# Patient Record
Sex: Female | Born: 1970
Health system: Southern US, Community
[De-identification: ages and names within clinical notes are randomized; demographics above are authoritative.]

## PROBLEM LIST (undated history)

## (undated) DIAGNOSIS — G43909 Migraine, unspecified, not intractable, without status migrainosus: Secondary | ICD-10-CM

## (undated) DIAGNOSIS — G479 Sleep disorder, unspecified: Secondary | ICD-10-CM

## (undated) DIAGNOSIS — F32A Depression, unspecified: Secondary | ICD-10-CM

## (undated) DIAGNOSIS — M542 Cervicalgia: Secondary | ICD-10-CM

## (undated) DIAGNOSIS — F329 Major depressive disorder, single episode, unspecified: Secondary | ICD-10-CM

## (undated) DIAGNOSIS — G8929 Other chronic pain: Secondary | ICD-10-CM

## (undated) DIAGNOSIS — E039 Hypothyroidism, unspecified: Secondary | ICD-10-CM

## (undated) DIAGNOSIS — R87619 Unspecified abnormal cytological findings in specimens from cervix uteri: Secondary | ICD-10-CM

## (undated) DIAGNOSIS — E079 Disorder of thyroid, unspecified: Secondary | ICD-10-CM

## (undated) DIAGNOSIS — M797 Fibromyalgia: Secondary | ICD-10-CM

## (undated) DIAGNOSIS — K219 Gastro-esophageal reflux disease without esophagitis: Secondary | ICD-10-CM

## (undated) DIAGNOSIS — F419 Anxiety disorder, unspecified: Secondary | ICD-10-CM

## (undated) HISTORY — DX: Hypothyroidism, unspecified: E03.9

## (undated) HISTORY — DX: Anxiety disorder, unspecified: F41.9

## (undated) HISTORY — PX: TUBAL LIGATION: SHX77

## (undated) HISTORY — DX: Major depressive disorder, single episode, unspecified: F32.9

## (undated) HISTORY — DX: Sleep disorder, unspecified: G47.9

## (undated) HISTORY — DX: Unspecified abnormal cytological findings in specimens from cervix uteri: R87.619

## (undated) HISTORY — DX: Depression, unspecified: F32.A

## (undated) HISTORY — DX: Gastro-esophageal reflux disease without esophagitis: K21.9

---

## 2005-03-01 DIAGNOSIS — J4520 Mild intermittent asthma, uncomplicated: Secondary | ICD-10-CM | POA: Insufficient documentation

## 2007-10-04 DIAGNOSIS — M797 Fibromyalgia: Secondary | ICD-10-CM | POA: Insufficient documentation

## 2010-11-21 DIAGNOSIS — K219 Gastro-esophageal reflux disease without esophagitis: Secondary | ICD-10-CM | POA: Insufficient documentation

## 2010-11-21 DIAGNOSIS — G43719 Chronic migraine without aura, intractable, without status migrainosus: Secondary | ICD-10-CM | POA: Insufficient documentation

## 2011-08-24 DIAGNOSIS — G479 Sleep disorder, unspecified: Secondary | ICD-10-CM | POA: Insufficient documentation

## 2011-10-02 DIAGNOSIS — M47816 Spondylosis without myelopathy or radiculopathy, lumbar region: Secondary | ICD-10-CM | POA: Insufficient documentation

## 2013-07-04 DIAGNOSIS — F338 Other recurrent depressive disorders: Secondary | ICD-10-CM | POA: Insufficient documentation

## 2015-03-11 DIAGNOSIS — E039 Hypothyroidism, unspecified: Secondary | ICD-10-CM | POA: Insufficient documentation

## 2015-03-11 DIAGNOSIS — Z719 Counseling, unspecified: Secondary | ICD-10-CM | POA: Insufficient documentation

## 2015-04-05 DIAGNOSIS — R51 Headache: Secondary | ICD-10-CM

## 2015-04-05 DIAGNOSIS — R519 Headache, unspecified: Secondary | ICD-10-CM | POA: Insufficient documentation

## 2015-06-22 ENCOUNTER — Ambulatory Visit: Payer: Self-pay | Admitting: Physician Assistant

## 2015-06-22 ENCOUNTER — Encounter: Payer: Self-pay | Admitting: Physician Assistant

## 2015-06-22 VITALS — BP 130/84 | HR 100 | Temp 97.9°F

## 2015-06-22 DIAGNOSIS — L03119 Cellulitis of unspecified part of limb: Principal | ICD-10-CM

## 2015-06-22 DIAGNOSIS — L02419 Cutaneous abscess of limb, unspecified: Secondary | ICD-10-CM

## 2015-06-22 MED ORDER — CLINDAMYCIN HCL 300 MG PO CAPS: 300.0000 mg | ORAL_CAPSULE | Freq: Four times a day (QID) | ORAL | Status: DC

## 2015-06-22 NOTE — Progress Notes (Signed)
S: c/o large bump on r inner thigh, states it was a small bump yesterday and today it has gotten really big and red, states her charge nurse said it looked like mrsa, no fever/chills, no recent antibiotic use  O: vitals wnl, nad, skin with large warm red area with hard dark center, no drainage, no pustule noted, n/v intact  A: cellulitis/abscess, ?mrsa  P: clindamycin  qid, recheck tomorrow, no work today, pt is to go home and apply wet warm compress, clean all areas in house with bleach, recommend 1/4 cup bleach in full tub of water for bleach bath once a week

## 2015-06-23 ENCOUNTER — Ambulatory Visit: Payer: Self-pay | Admitting: Physician Assistant

## 2015-06-23 ENCOUNTER — Encounter: Payer: Self-pay | Admitting: Physician Assistant

## 2015-06-23 VITALS — BP 110/80 | HR 84 | Temp 98.4°F

## 2015-06-23 NOTE — Progress Notes (Signed)
S: here for recheck of abscess, no fever/chills, has been applying heat, taking clindamycin po; no drainage from site  O: vitals wnl, nad, skin with decreased redness/swelling, area at center still heard and tender with deep red color, no drainage noted, n/v intact  A: cellulitis recheck  P: continue clinda, warm compress, return on Friday for recheck

## 2015-06-24 DIAGNOSIS — M797 Fibromyalgia: Secondary | ICD-10-CM | POA: Diagnosis not present

## 2015-06-24 DIAGNOSIS — R51 Headache: Secondary | ICD-10-CM | POA: Diagnosis not present

## 2015-06-25 ENCOUNTER — Encounter: Payer: Self-pay | Admitting: Physician Assistant

## 2015-06-25 ENCOUNTER — Ambulatory Visit: Payer: Self-pay | Admitting: Physician Assistant

## 2015-06-25 VITALS — BP 130/80 | HR 82 | Temp 98.3°F

## 2015-06-25 DIAGNOSIS — L0291 Cutaneous abscess, unspecified: Secondary | ICD-10-CM

## 2015-06-25 DIAGNOSIS — L0390 Cellulitis, unspecified: Principal | ICD-10-CM

## 2015-06-25 NOTE — Progress Notes (Signed)
S: here for recheck of abscess. No fever or chills, has cleaned everything in house with bleach products, no drainage from site  O: vitals wnl, nad, skin with less redness, area at abscess is less red, appears bruised, no drainage, n/v intact  A: recheck of abscess  P: warm water soaks in tub, continue antibiotic, recheck on Monday

## 2015-06-28 ENCOUNTER — Encounter: Payer: Self-pay | Admitting: Physician Assistant

## 2015-06-28 ENCOUNTER — Ambulatory Visit: Payer: Self-pay | Admitting: Physician Assistant

## 2015-06-28 VITALS — BP 120/84 | Temp 98.6°F

## 2015-06-28 DIAGNOSIS — L0390 Cellulitis, unspecified: Principal | ICD-10-CM

## 2015-06-28 DIAGNOSIS — L0291 Cutaneous abscess, unspecified: Secondary | ICD-10-CM

## 2015-06-28 NOTE — Progress Notes (Signed)
S:  Here for recheck of abscess, no fever chills, is getting better  O: vitals wnl, nad, skin with healing abscess, decreased redness and swelling, n/v intact  A: healing abscess  P: f/u prn

## 2015-06-29 DIAGNOSIS — G4733 Obstructive sleep apnea (adult) (pediatric): Secondary | ICD-10-CM | POA: Diagnosis not present

## 2015-06-29 DIAGNOSIS — G4710 Hypersomnia, unspecified: Secondary | ICD-10-CM | POA: Diagnosis not present

## 2015-06-29 DIAGNOSIS — G4726 Circadian rhythm sleep disorder, shift work type: Secondary | ICD-10-CM | POA: Diagnosis not present

## 2015-06-29 DIAGNOSIS — Z6833 Body mass index (BMI) 33.0-33.9, adult: Secondary | ICD-10-CM | POA: Diagnosis not present

## 2015-07-12 ENCOUNTER — Ambulatory Visit
Admission: EM | Admit: 2015-07-12 | Discharge: 2015-07-12 | Disposition: A | Discharge status: Home / Self Care | Payer: 59 | Attending: Family Medicine | Admitting: Family Medicine

## 2015-07-12 ENCOUNTER — Encounter: Payer: Self-pay | Admitting: Emergency Medicine

## 2015-07-12 DIAGNOSIS — S29012A Strain of muscle and tendon of back wall of thorax, initial encounter: Secondary | ICD-10-CM | POA: Diagnosis not present

## 2015-07-12 HISTORY — DX: Migraine, unspecified, not intractable, without status migrainosus: G43.909

## 2015-07-12 HISTORY — DX: Disorder of thyroid, unspecified: E07.9

## 2015-07-12 HISTORY — DX: Fibromyalgia: M79.7

## 2015-07-12 MED ORDER — HYDROCODONE-ACETAMINOPHEN 5-325 MG PO TABS: ORAL_TABLET | ORAL | Status: DC

## 2015-07-12 NOTE — ED Notes (Signed)
Patient states that she pulled a muscle in her neck and left shoulder a week ago.  Patient reports pain that goes down her left arm.  Patient denies chest pain or SOB.

## 2015-07-12 NOTE — ED Provider Notes (Signed)
CSN: [id number]     Arrival date & time 07/12/15  1148 History   None    Chief Complaint  Patient presents with  . Shoulder Pain  . Neck Pain   (Consider location/radiation/quality/duration/timing/severity/associated sxs/prior Treatment) HPI Comments: 45 yo female with a 1 week h/o left upper back pain, radiating into the shoulder area. States woke up one morning with pain. Denies any falls, injury, trauma, fevers, chills, numbness.   The history is provided by the patient.    Past Medical History  Diagnosis Date  . Fibromyalgia   . Migraine   . Thyroid disease    Past Surgical History  Procedure Laterality Date  . Tubal ligation     History reviewed. No pertinent family history. Social History  Substance Use Topics  . Smoking status: Former Games developer  . Smokeless tobacco: None  . Alcohol Use: No   OB History    No data available     Review of Systems  Allergies  Amlodipine; Atenolol; Nystatin; Phenergan; and Trazodone and nefazodone  Home Medications   Prior to Admission medications   Medication Sig Start Date End Date Taking? Authorizing Provider  levothyroxine (SYNTHROID, LEVOTHROID) 88 MCG tablet Take 88 mcg by mouth daily before breakfast.   Yes Historical Provider, MD  Prenatal Multivit-Min-Fe-FA (PRENATAL VITAMINS PO) Take 1 tablet by mouth daily.   Yes Historical Provider, MD  Butalbital-APAP-Caffeine 50-325-40 MG capsule Take 1 tablet at headache onset. 06/24/15   Historical Provider, MD  clindamycin (CLEOCIN) 300 MG capsule Take 1 capsule (300 mg total) by mouth 4 (four) times daily. 06/22/15   Faythe Ghee, PA-C  FLECTOR 1.3 % [facility]  06/09/15   Historical Provider, MD  fluticasone (FLONASE) 50 MCG/ACT nasal spray Place into both nostrils daily.    Historical Provider, MD  gabapentin (NEURONTIN) 100 MG capsule  05/12/15   Historical Provider, MD  HYDROcodone-acetaminophen (NORCO/VICODIN) 5-325 MG tablet 1 tab po qd prn 07/12/15   Payton Mccallum, MD  levothyroxine  (SYNTHROID, LEVOTHROID) 100 MCG tablet Take 100 mcg by mouth daily before breakfast.    Historical Provider, MD  LYRICA 25 MG capsule Take 25 mg by mouth 2 (two) times daily.  06/09/15   Historical Provider, MD  modafinil (PROVIGIL) 200 MG tablet  03/25/15   Historical Provider, MD  nortriptyline (PAMELOR) 10 MG capsule Take 20 mg by mouth at bedtime.     Historical Provider, MD  ondansetron (ZOFRAN-ODT) 4 MG disintegrating tablet Take 4 mg by mouth every 8 (eight) hours as needed for nausea or vomiting.    Historical Provider, MD   Meds Ordered and Administered this Visit  Medications - No data to display  BP 106/52 mmHg  Pulse 86  Temp(Src) 98.5 F (36.9 C) (Tympanic)  Resp 16  Ht  (1.6 m)  Wt 180 lb (81.647 kg)  BMI 31.89 kg/m2  SpO2 100%  LMP 06/22/2015 (Approximate) No data found.   Physical Exam  Constitutional: She is oriented to person, place, and time. She appears well-developed and well-nourished. No distress.  HENT:  Head: Normocephalic and atraumatic.  Eyes: EOM are normal. Pupils are equal, round, and reactive to light.  Neck: Normal range of motion. Neck supple. No tracheal deviation present. No thyromegaly present.  Musculoskeletal: She exhibits no edema.       Cervical back: She exhibits tenderness (to palpation over the left trapezius muscle) and spasm. She exhibits normal range of motion, no bony tenderness, no swelling, no edema, no deformity, no laceration  and normal pulse.  Lymphadenopathy:    She has no cervical adenopathy.  Neurological: She is alert and oriented to person, place, and time. She has normal reflexes. No cranial nerve deficit. She exhibits normal muscle tone. Coordination normal.  Skin: She is not diaphoretic.  Nursing note and vitals reviewed.   ED Course  Procedures (including critical care time)  Labs Review Labs Reviewed - No data to display  Imaging Review No results found.   Visual Acuity Review  Right Eye Distance:    Left Eye Distance:   Bilateral Distance:    Right Eye Near:   Left Eye Near:    Bilateral Near:         MDM   1. Upper back strain, initial encounter    Discharge Medication List as of 07/12/2015  1:25 PM    START taking these medications   Details  HYDROcodone-acetaminophen (NORCO/VICODIN) 5-325 MG tablet 1 tab po qd prn, Print       1. diagnosis reviewed with patient 2. rx as per orders above; reviewed possible side effects, interactions, risks and benefits; also recommend patient take flexeril qhs (patient has rx at home) 3. Recommend supportive treatment with heat, stretching  4. Follow-up prn if symptoms worsen or don't improve    Payton Mccallum, MD 07/12/15 1342

## 2015-08-03 DIAGNOSIS — M50321 Other cervical disc degeneration at C4-C5 level: Secondary | ICD-10-CM | POA: Diagnosis not present

## 2015-08-03 DIAGNOSIS — M2428 Disorder of ligament, vertebrae: Secondary | ICD-10-CM | POA: Diagnosis not present

## 2015-08-03 DIAGNOSIS — M791 Myalgia: Secondary | ICD-10-CM | POA: Diagnosis not present

## 2015-08-03 DIAGNOSIS — M9901 Segmental and somatic dysfunction of cervical region: Secondary | ICD-10-CM | POA: Diagnosis not present

## 2015-08-04 DIAGNOSIS — Z6834 Body mass index (BMI) 34.0-34.9, adult: Secondary | ICD-10-CM | POA: Diagnosis not present

## 2015-08-04 DIAGNOSIS — M9901 Segmental and somatic dysfunction of cervical region: Secondary | ICD-10-CM | POA: Diagnosis not present

## 2015-08-04 DIAGNOSIS — M791 Myalgia: Secondary | ICD-10-CM | POA: Diagnosis not present

## 2015-08-04 DIAGNOSIS — M50321 Other cervical disc degeneration at C4-C5 level: Secondary | ICD-10-CM | POA: Diagnosis not present

## 2015-08-04 DIAGNOSIS — M542 Cervicalgia: Secondary | ICD-10-CM | POA: Diagnosis not present

## 2015-08-04 DIAGNOSIS — M2428 Disorder of ligament, vertebrae: Secondary | ICD-10-CM | POA: Diagnosis not present

## 2015-08-05 DIAGNOSIS — M50321 Other cervical disc degeneration at C4-C5 level: Secondary | ICD-10-CM | POA: Diagnosis not present

## 2015-08-05 DIAGNOSIS — M2428 Disorder of ligament, vertebrae: Secondary | ICD-10-CM | POA: Diagnosis not present

## 2015-08-05 DIAGNOSIS — M9901 Segmental and somatic dysfunction of cervical region: Secondary | ICD-10-CM | POA: Diagnosis not present

## 2015-08-05 DIAGNOSIS — M791 Myalgia: Secondary | ICD-10-CM | POA: Diagnosis not present

## 2015-08-09 DIAGNOSIS — M9901 Segmental and somatic dysfunction of cervical region: Secondary | ICD-10-CM | POA: Diagnosis not present

## 2015-08-09 DIAGNOSIS — M50321 Other cervical disc degeneration at C4-C5 level: Secondary | ICD-10-CM | POA: Diagnosis not present

## 2015-08-09 DIAGNOSIS — M2428 Disorder of ligament, vertebrae: Secondary | ICD-10-CM | POA: Diagnosis not present

## 2015-08-09 DIAGNOSIS — M791 Myalgia: Secondary | ICD-10-CM | POA: Diagnosis not present

## 2015-08-12 DIAGNOSIS — M50321 Other cervical disc degeneration at C4-C5 level: Secondary | ICD-10-CM | POA: Diagnosis not present

## 2015-08-12 DIAGNOSIS — M2428 Disorder of ligament, vertebrae: Secondary | ICD-10-CM | POA: Diagnosis not present

## 2015-08-12 DIAGNOSIS — M791 Myalgia: Secondary | ICD-10-CM | POA: Diagnosis not present

## 2015-08-12 DIAGNOSIS — M9901 Segmental and somatic dysfunction of cervical region: Secondary | ICD-10-CM | POA: Diagnosis not present

## 2015-08-13 DIAGNOSIS — M791 Myalgia: Secondary | ICD-10-CM | POA: Diagnosis not present

## 2015-08-13 DIAGNOSIS — M2428 Disorder of ligament, vertebrae: Secondary | ICD-10-CM | POA: Diagnosis not present

## 2015-08-13 DIAGNOSIS — M50321 Other cervical disc degeneration at C4-C5 level: Secondary | ICD-10-CM | POA: Diagnosis not present

## 2015-08-13 DIAGNOSIS — M9901 Segmental and somatic dysfunction of cervical region: Secondary | ICD-10-CM | POA: Diagnosis not present

## 2015-08-15 DIAGNOSIS — M5412 Radiculopathy, cervical region: Secondary | ICD-10-CM | POA: Diagnosis not present

## 2015-08-15 DIAGNOSIS — M542 Cervicalgia: Secondary | ICD-10-CM | POA: Diagnosis not present

## 2015-08-15 DIAGNOSIS — Z01419 Encounter for gynecological examination (general) (routine) without abnormal findings: Secondary | ICD-10-CM | POA: Diagnosis not present

## 2015-08-17 DIAGNOSIS — M9901 Segmental and somatic dysfunction of cervical region: Secondary | ICD-10-CM | POA: Diagnosis not present

## 2015-08-17 DIAGNOSIS — M50321 Other cervical disc degeneration at C4-C5 level: Secondary | ICD-10-CM | POA: Diagnosis not present

## 2015-08-17 DIAGNOSIS — M2428 Disorder of ligament, vertebrae: Secondary | ICD-10-CM | POA: Diagnosis not present

## 2015-08-17 DIAGNOSIS — M791 Myalgia: Secondary | ICD-10-CM | POA: Diagnosis not present

## 2015-08-18 DIAGNOSIS — M50321 Other cervical disc degeneration at C4-C5 level: Secondary | ICD-10-CM | POA: Diagnosis not present

## 2015-08-18 DIAGNOSIS — M791 Myalgia: Secondary | ICD-10-CM | POA: Diagnosis not present

## 2015-08-18 DIAGNOSIS — M2428 Disorder of ligament, vertebrae: Secondary | ICD-10-CM | POA: Diagnosis not present

## 2015-08-18 DIAGNOSIS — M9901 Segmental and somatic dysfunction of cervical region: Secondary | ICD-10-CM | POA: Diagnosis not present

## 2015-08-19 DIAGNOSIS — M9901 Segmental and somatic dysfunction of cervical region: Secondary | ICD-10-CM | POA: Diagnosis not present

## 2015-08-19 DIAGNOSIS — M2428 Disorder of ligament, vertebrae: Secondary | ICD-10-CM | POA: Diagnosis not present

## 2015-08-19 DIAGNOSIS — M50321 Other cervical disc degeneration at C4-C5 level: Secondary | ICD-10-CM | POA: Diagnosis not present

## 2015-08-19 DIAGNOSIS — M791 Myalgia: Secondary | ICD-10-CM | POA: Diagnosis not present

## 2015-08-24 DIAGNOSIS — Z6834 Body mass index (BMI) 34.0-34.9, adult: Secondary | ICD-10-CM | POA: Diagnosis not present

## 2015-08-24 DIAGNOSIS — M5412 Radiculopathy, cervical region: Secondary | ICD-10-CM | POA: Diagnosis not present

## 2015-08-26 DIAGNOSIS — R51 Headache: Secondary | ICD-10-CM | POA: Diagnosis not present

## 2015-08-26 DIAGNOSIS — M542 Cervicalgia: Secondary | ICD-10-CM | POA: Diagnosis not present

## 2015-08-26 DIAGNOSIS — M797 Fibromyalgia: Secondary | ICD-10-CM | POA: Diagnosis not present

## 2015-09-04 ENCOUNTER — Emergency Department: Payer: 59

## 2015-09-04 ENCOUNTER — Emergency Department
Admission: EM | Admit: 2015-09-04 | Discharge: 2015-09-04 | Disposition: A | Discharge status: Home / Self Care | Payer: 59 | Attending: Emergency Medicine | Admitting: Emergency Medicine

## 2015-09-04 DIAGNOSIS — Z7951 Long term (current) use of inhaled steroids: Secondary | ICD-10-CM | POA: Insufficient documentation

## 2015-09-04 DIAGNOSIS — M5412 Radiculopathy, cervical region: Secondary | ICD-10-CM | POA: Insufficient documentation

## 2015-09-04 DIAGNOSIS — E039 Hypothyroidism, unspecified: Secondary | ICD-10-CM | POA: Diagnosis not present

## 2015-09-04 DIAGNOSIS — Z792 Long term (current) use of antibiotics: Secondary | ICD-10-CM | POA: Insufficient documentation

## 2015-09-04 DIAGNOSIS — Z87891 Personal history of nicotine dependence: Secondary | ICD-10-CM | POA: Insufficient documentation

## 2015-09-04 DIAGNOSIS — Z79899 Other long term (current) drug therapy: Secondary | ICD-10-CM | POA: Diagnosis not present

## 2015-09-04 DIAGNOSIS — M797 Fibromyalgia: Secondary | ICD-10-CM | POA: Diagnosis not present

## 2015-09-04 DIAGNOSIS — M542 Cervicalgia: Secondary | ICD-10-CM | POA: Diagnosis not present

## 2015-09-04 MED ORDER — METHYLPREDNISOLONE 4 MG PO TBPK: ORAL_TABLET | ORAL | Status: DC

## 2015-09-04 MED ORDER — PREDNISONE 20 MG PO TABS
60.0000 mg | ORAL_TABLET | Freq: Once | ORAL | Status: AC
  Administered 2015-09-04: 60 mg via ORAL
  Filled 2015-09-04: qty 3

## 2015-09-04 NOTE — ED Notes (Addendum)
Pt reports left neck and shoulder pain for past 2 months. Pt states she has tried muscle relaxer, chiropractor with no relief. Was told she has a pinched nerve in her neck. Denies chest pain or palpitations. Also states she has fibromyalgia and the pain is constant and interferes with her "quality of life"

## 2015-09-04 NOTE — ED Notes (Signed)
Discussed discharge instructions, prescriptions, and follow-up care with patient. No questions or concerns at this time. Pt stable at discharge.  

## 2015-09-04 NOTE — ED Provider Notes (Signed)
[facility] Emergency Department Provider Note  ____________________________________________  Time seen: Approximately 8:00 AM  I have reviewed the triage vital signs and the nursing notes.   HISTORY  Chief Complaint Shoulder Pain    HPI Sandy Mitchell is a 45 y.o. female patient complaining of left neck and shoulder pain for 2 months. Patient states she seen her PCP Vaughan SineKrol Proctor and neurologist for any relief. Patient's toe she had a pinched nerve in her neck and is no improvement follow-up to consider EMG studies and CT scan. Patient also states she has fibromyalgia which she believe is complicating her pain. Patient states she has called the neurologist's office earlier this week was not received a call back for an appointment. Patient is rating the pain as a 6/10.   Past Medical History  Diagnosis Date  . Fibromyalgia   . Migraine   . Thyroid disease     There are no active problems to display for this patient.   Past Surgical History  Procedure Laterality Date  . Tubal ligation      Current Outpatient Rx  Name  Route  Sig  Dispense  Refill  . Butalbital-APAP-Caffeine 50-325-40 MG capsule      Take 1 tablet at headache onset.         . clindamycin (CLEOCIN) 300 MG capsule   Oral   Take 1 capsule (300 mg total) by mouth 4 (four) times daily.   28 capsule   0   . FLECTOR 1.3 % [facility]            0     Dispense as written.   . fluticasone (FLONASE) 50 MCG/ACT nasal spray   Each Nare   Place into both nostrils daily.         Marland Kitchen. gabapentin (NEURONTIN) 100 MG capsule            11   . HYDROcodone-acetaminophen (NORCO/VICODIN) 5-325 MG tablet      1 tab po qd prn   4 tablet   0   . levothyroxine (SYNTHROID, LEVOTHROID) 100 MCG tablet   Oral   Take 100 mcg by mouth daily before breakfast.         . levothyroxine (SYNTHROID, LEVOTHROID) 88 MCG tablet   Oral   Take 88 mcg by mouth daily before breakfast.         .  LYRICA 25 MG capsule   Oral   Take 25 mg by mouth 2 (two) times daily.       1     Dispense as written.   . methylPREDNISolone (MEDROL DOSEPAK) 4 MG TBPK tablet      Take Tapered dose as directed   21 tablet   0   . modafinil (PROVIGIL) 200 MG tablet            0   . nortriptyline (PAMELOR) 10 MG capsule   Oral   Take 20 mg by mouth at bedtime.          . ondansetron (ZOFRAN-ODT) 4 MG disintegrating tablet   Oral   Take 4 mg by mouth every 8 (eight) hours as needed for nausea or vomiting.         . Prenatal Multivit-Min-Fe-FA (PRENATAL VITAMINS PO)   Oral   Take 1 tablet by mouth daily.           Allergies Amlodipine; Atenolol; Melatonin; Nystatin; Phenergan; and Trazodone and nefazodone  No family history on file.  Social History  Social History  Substance Use Topics  . Smoking status: Former Games developer  . Smokeless tobacco: Not on file  . Alcohol Use: No    Review of Systems Constitutional: No fever/chills Eyes: No visual changes. ENT: No sore throat. Cardiovascular: Denies chest pain. Respiratory: Denies shortness of breath. Gastrointestinal: No abdominal pain.  No nausea, no vomiting.  No diarrhea.  No constipation. Genitourinary: Negative for dysuria. Musculoskeletal: Neck and left shoulder pain Skin: Negative for rash. Neurological: Negative for headaches, focal weakness or numbness. Psychiatric:Fibromyalgia Endocrine:Hypothyroidism Hematological/Lymphatic: Allergic/Immunilogical: See medication list  ___________________________________________   PHYSICAL EXAM:  VITAL SIGNS: ED Triage Vitals  Enc Vitals Group     BP 09/04/15 0741 155/85 mmHg     Pulse Rate 09/04/15 0741 108     Resp 09/04/15 0741 20     Temp 09/04/15 0741 97.6 F (36.4 C)     Temp Source 09/04/15 0741 Oral     SpO2 09/04/15 0741 99 %     Weight 09/04/15 0741 196 lb (88.905 kg)     Height 09/04/15 0741  (1.6 m)     Head Cir --      Peak Flow --      Pain  Score 09/04/15 0745 6     Pain Loc --      Pain Edu? --      Excl. in GC? --     Constitutional: Alert and oriented. Well appearing and in no acute distress. Eyes: Conjunctivae are normal. PERRL. EOMI. Head: Atraumatic. Nose: No congestion/rhinnorhea. Mouth/Throat: Mucous membranes are moist.  Oropharynx non-erythematous. Neck: No stridor.  No cervical spine tenderness to palpation. Hematological/Lymphatic/Immunilogical: No cervical lymphadenopathy. Cardiovascular: Normal rate, regular rhythm. Grossly normal heart sounds.  Good peripheral circulation. Respiratory: Normal respiratory effort.  No retractions. Lungs CTAB. Gastrointestinal: Soft and nontender. No distention. No abdominal bruits. No CVA tenderness. Musculoskeletal:No obvious deformity of cervical spine or left upper extremity. Patient has full and equal range of motion of the neck and upper extremity.. Neurologic:  Normal speech and language. No gross focal neurologic deficits are appreciated. No gait instability. Skin:  Skin is warm, dry and intact. No rash noted. Psychiatric: Mood and affect are normal. Speech and behavior are normal.  ____________________________________________   LABS (all labs ordered are listed, but only abnormal results are displayed)  Labs Reviewed - No data to display ____________________________________________  EKG   ____________________________________________  RADIOLOGY  X-rays shows osteophyte between C5 and C6. I, Joni Reining, personally viewed and evaluated these images (plain radiographs) as part of my medical decision making, as well as reviewing the written report by the radiologist.  ____________________________________________   PROCEDURES  Procedure(s) performed: None  Critical Care performed: No  ____________________________________________   INITIAL IMPRESSION / ASSESSMENT AND PLAN / ED COURSE  Pertinent labs & imaging results that were available during my  care of the patient were reviewed by me and considered in my medical decision making (see chart for details).  Cervical radiculopathy to the left upper extremity.. Discussed x-ray finding with patient. Patient placed in arm sling and given a prescription for Medrol Dosepak. Patient advised to follow-up with her treating neurologist. ____________________________________________   FINAL CLINICAL IMPRESSION(S) / ED DIAGNOSES  Final diagnoses:  Cervical radiculopathy at C6      Joni Reining, PA-C 09/04/15 0920  Arnaldo Natal, MD 09/04/15 1549

## 2015-09-04 NOTE — Discharge Instructions (Signed)
wear arm sling for 3-5 days while awake pending reevaluation by neurologist. Cervical Radiculopathy Cervical radiculopathy means that a nerve in the neck is pinched or bruised. This can cause pain or loss of feeling (numbness) that runs from your neck to your arm and fingers. HOME CARE Managing Pain  Take over-the-counter and prescription medicines only as told by your doctor.  If directed, put ice on the injured or painful area.  Put ice in a plastic bag.  Place a towel between your skin and the bag.  Leave the ice on for 20 minutes, 2-3 times per day.  If ice does not help, you can try using heat. Take a warm shower or warm bath, or use a heat pack as told by your doctor.  You may try a gentle neck and shoulder massage. Activity  Rest as needed. Follow instructions from your doctor about any activities to avoid.  Do exercises as told by your doctor or physical therapist. General Instructions   If you were given a soft collar, wear it as told by your doctor.  Use a flat pillow when you sleep.  Keep all follow-up visits as told by your doctor. This is important. GET HELP IF:  Your condition does not improve with treatment. GET HELP RIGHT AWAY IF:   Your pain gets worse and is not controlled with medicine.  You lose feeling or feel weak in your hand, arm, face, or leg.  You have a fever.  You have a stiff neck.  You cannot control when you poop or pee (have incontinence).  You have trouble with walking, balance, or talking.   This information is not intended to replace advice given to you by your health care provider. Make sure you discuss any questions you have with your health care provider.   Document Released: 05/11/2011 Document Revised: 02/10/2015 Document Reviewed: 07/16/2014 Elsevier Interactive Patient Education [employer]2016 Elsevier Inc.

## 2015-09-07 ENCOUNTER — Emergency Department
Admission: EM | Admit: 2015-09-07 | Discharge: 2015-09-07 | Disposition: A | Discharge status: Home / Self Care | Payer: 59 | Source: Home / Self Care | Attending: Emergency Medicine | Admitting: Emergency Medicine

## 2015-09-07 ENCOUNTER — Encounter: Payer: Self-pay | Admitting: Emergency Medicine

## 2015-09-07 DIAGNOSIS — Y999 Unspecified external cause status: Secondary | ICD-10-CM

## 2015-09-07 DIAGNOSIS — W57XXXA Bitten or stung by nonvenomous insect and other nonvenomous arthropods, initial encounter: Secondary | ICD-10-CM | POA: Insufficient documentation

## 2015-09-07 DIAGNOSIS — Z87891 Personal history of nicotine dependence: Secondary | ICD-10-CM | POA: Insufficient documentation

## 2015-09-07 DIAGNOSIS — Y939 Activity, unspecified: Secondary | ICD-10-CM

## 2015-09-07 DIAGNOSIS — Y929 Unspecified place or not applicable: Secondary | ICD-10-CM | POA: Insufficient documentation

## 2015-09-07 DIAGNOSIS — L03311 Cellulitis of abdominal wall: Secondary | ICD-10-CM | POA: Insufficient documentation

## 2015-09-07 MED ORDER — SULFAMETHOXAZOLE-TRIMETHOPRIM 800-160 MG PO TABS: 1.0000 | ORAL_TABLET | Freq: Two times a day (BID) | ORAL | Status: DC

## 2015-09-07 MED ORDER — TRAMADOL HCL 50 MG PO TABS: 50.0000 mg | ORAL_TABLET | Freq: Four times a day (QID) | ORAL | Status: DC | PRN

## 2015-09-07 NOTE — Discharge Instructions (Signed)

## 2015-09-07 NOTE — ED Provider Notes (Signed)
CSN: [id number]     Arrival date & time 09/07/15  1742 History   First MD Initiated Contact with Patient 09/07/15 1948     Chief Complaint  Patient presents with  . Insect Bite     HPI   45 year old female who presents to the emergency department for evaluation of a potential insect bite. She states that she noticed the area a couple days ago, but didn't pay much attention to it. Over the past 2 days it has become tender and hard and the redness has began to spread. She has not been using any ointments or taking any medications for it. She does state that she's had a history of MRSA, but none recently.  Past Medical History  Diagnosis Date  . Fibromyalgia   . Migraine   . Thyroid disease    Past Surgical History  Procedure Laterality Date  . Tubal ligation     No family history on file. Social History  Substance Use Topics  . Smoking status: Former Games developermoker  . Smokeless tobacco: None  . Alcohol Use: No   OB History    No data available     Review of Systems  Constitutional: Negative for fever and chills.  Respiratory: Negative.   Musculoskeletal: Negative.   Skin: Positive for color change.  Neurological: Negative.       Allergies  Amlodipine; Atenolol; Melatonin; Nystatin; Phenergan; and Trazodone and nefazodone  Home Medications   Prior to Admission medications   Medication Sig Start Date End Date Taking? Authorizing Provider  Butalbital-APAP-Caffeine 50-325-40 MG capsule Take 1 tablet at headache onset. 06/24/15   Historical Provider, MD  clindamycin (CLEOCIN) 300 MG capsule Take 1 capsule (300 mg total) by mouth 4 (four) times daily. 06/22/15   Faythe GheeSusan W Fisher, PA-C  [facility] 1.3 % [facility]  06/09/15   Historical Provider, MD  fluticasone (FLONASE) 50 MCG/ACT nasal spray Place into both nostrils daily.    Historical Provider, MD  gabapentin (NEURONTIN) 100 MG capsule  05/12/15   Historical Provider, MD  HYDROcodone-acetaminophen (NORCO/VICODIN) 5-325 MG tablet 1 tab po qd  prn 07/12/15   Payton Mccallumrlando Conty, MD  levothyroxine (SYNTHROID, LEVOTHROID) 100 MCG tablet Take 100 mcg by mouth daily before breakfast.    Historical Provider, MD  levothyroxine (SYNTHROID, LEVOTHROID) 88 MCG tablet Take 88 mcg by mouth daily before breakfast.    Historical Provider, MD  LYRICA 25 MG capsule Take 25 mg by mouth 2 (two) times daily.  06/09/15   Historical Provider, MD  methylPREDNISolone (MEDROL DOSEPAK) 4 MG TBPK tablet Take Tapered dose as directed 09/04/15   Joni Reiningonald K Smith, PA-C  modafinil (PROVIGIL) 200 MG tablet  03/25/15   Historical Provider, MD  nortriptyline (PAMELOR) 10 MG capsule Take 20 mg by mouth at bedtime.     Historical Provider, MD  ondansetron (ZOFRAN-ODT) 4 MG disintegrating tablet Take 4 mg by mouth every 8 (eight) hours as needed for nausea or vomiting.    Historical Provider, MD  Prenatal Multivit-Min-Fe-FA (PRENATAL VITAMINS PO) Take 1 tablet by mouth daily.    Historical Provider, MD  sulfamethoxazole-trimethoprim (BACTRIM DS,SEPTRA DS) 800-160 MG tablet Take 1 tablet by mouth 2 (two) times daily. 09/07/15   Chinita Pesterari B Blen Ransome, FNP  traMADol (ULTRAM) 50 MG tablet Take 1 tablet (50 mg total) by mouth every 6 (six) hours as needed. 09/07/15   Iola Turri B Chun Sellen, FNP   BP 105/70 mmHg  Pulse 128  Temp(Src) 98.7 F (37.1 C) (Oral)  Resp 20  Ht   (1.6 m)  Wt 88.451 kg  BMI 34.55 kg/m2  SpO2 97%  LMP 09/03/2015 Physical Exam  Constitutional: She is oriented to person, place, and time. She appears well-developed and well-nourished.  HENT:  Head: Normocephalic.  Eyes: EOM are normal.  Neck: Normal range of motion.  Pulmonary/Chest: Effort normal.  Abdominal: Soft.  Musculoskeletal: Normal range of motion.  Neurological: She is alert and oriented to person, place, and time.  Skin: Skin is warm and dry. Lesion noted. Rash is not pustular and not vesicular. There is erythema.     Psychiatric: She has a normal mood and affect. Her behavior is normal.  Nursing note and  vitals reviewed.   ED Course  Procedures (including critical care time) Labs Review Labs Reviewed - No data to display  Imaging Review No results found. I have personally reviewed and evaluated these images and lab results as part of my medical decision-making.   EKG Interpretation None      MDM   Final diagnoses:  Cellulitis of abdominal wall    Patient was placed on Bactrim twice per day and given a prescription for tramadol to be taken as needed for pain. She was encouraged to follow up with the primary care provider or return to emergency department for symptoms that are not improving over the next 2 days or symptoms that change or worsen.    Chinita Pester, FNP 09/08/15 6962  Sharman Cheek, MD 09/09/15 [phone]

## 2015-09-07 NOTE — ED Notes (Signed)
States she woke up with possible insect bite to right side of abd   large red area

## 2015-09-07 NOTE — ED Notes (Signed)
Pt discharged to home.  Discharge instructions reviewed.  Verbalized understanding.  No questions or concerns at this time.  Teach back verified.  Pt in NAD.  No items left in ED.   

## 2015-09-09 ENCOUNTER — Inpatient Hospital Stay
Admission: EM | Admit: 2015-09-09 | Discharge: 2015-09-17 | DRG: 872 | Disposition: A | Discharge status: Home / Self Care | Payer: 59 | Attending: Internal Medicine | Admitting: Internal Medicine

## 2015-09-09 ENCOUNTER — Encounter: Payer: Self-pay | Admitting: Urgent Care

## 2015-09-09 DIAGNOSIS — A419 Sepsis, unspecified organism: Secondary | ICD-10-CM | POA: Diagnosis not present

## 2015-09-09 DIAGNOSIS — M797 Fibromyalgia: Secondary | ICD-10-CM | POA: Diagnosis present

## 2015-09-09 DIAGNOSIS — Z79899 Other long term (current) drug therapy: Secondary | ICD-10-CM

## 2015-09-09 DIAGNOSIS — R7301 Impaired fasting glucose: Secondary | ICD-10-CM | POA: Diagnosis present

## 2015-09-09 DIAGNOSIS — E872 Acidosis: Secondary | ICD-10-CM | POA: Diagnosis present

## 2015-09-09 DIAGNOSIS — G8929 Other chronic pain: Secondary | ICD-10-CM | POA: Diagnosis present

## 2015-09-09 DIAGNOSIS — E861 Hypovolemia: Secondary | ICD-10-CM | POA: Diagnosis present

## 2015-09-09 DIAGNOSIS — E860 Dehydration: Secondary | ICD-10-CM | POA: Diagnosis present

## 2015-09-09 DIAGNOSIS — L0291 Cutaneous abscess, unspecified: Secondary | ICD-10-CM

## 2015-09-09 DIAGNOSIS — Z888 Allergy status to other drugs, medicaments and biological substances status: Secondary | ICD-10-CM

## 2015-09-09 DIAGNOSIS — E039 Hypothyroidism, unspecified: Secondary | ICD-10-CM | POA: Diagnosis present

## 2015-09-09 DIAGNOSIS — Z87891 Personal history of nicotine dependence: Secondary | ICD-10-CM | POA: Diagnosis not present

## 2015-09-09 DIAGNOSIS — R6520 Severe sepsis without septic shock: Secondary | ICD-10-CM | POA: Diagnosis present

## 2015-09-09 DIAGNOSIS — R000 Tachycardia, unspecified: Secondary | ICD-10-CM | POA: Diagnosis not present

## 2015-09-09 DIAGNOSIS — G43909 Migraine, unspecified, not intractable, without status migrainosus: Secondary | ICD-10-CM | POA: Diagnosis present

## 2015-09-09 DIAGNOSIS — L03311 Cellulitis of abdominal wall: Secondary | ICD-10-CM | POA: Diagnosis not present

## 2015-09-09 DIAGNOSIS — M542 Cervicalgia: Secondary | ICD-10-CM | POA: Diagnosis present

## 2015-09-09 DIAGNOSIS — L03313 Cellulitis of chest wall: Secondary | ICD-10-CM | POA: Diagnosis present

## 2015-09-09 DIAGNOSIS — N179 Acute kidney failure, unspecified: Secondary | ICD-10-CM | POA: Diagnosis present

## 2015-09-09 DIAGNOSIS — R109 Unspecified abdominal pain: Secondary | ICD-10-CM | POA: Diagnosis not present

## 2015-09-09 DIAGNOSIS — N170 Acute kidney failure with tubular necrosis: Secondary | ICD-10-CM | POA: Diagnosis not present

## 2015-09-09 DIAGNOSIS — E871 Hypo-osmolality and hyponatremia: Secondary | ICD-10-CM | POA: Diagnosis present

## 2015-09-09 DIAGNOSIS — E2740 Unspecified adrenocortical insufficiency: Secondary | ICD-10-CM | POA: Diagnosis present

## 2015-09-09 DIAGNOSIS — E876 Hypokalemia: Secondary | ICD-10-CM | POA: Diagnosis present

## 2015-09-09 DIAGNOSIS — I959 Hypotension, unspecified: Secondary | ICD-10-CM | POA: Diagnosis not present

## 2015-09-09 DIAGNOSIS — L0390 Cellulitis, unspecified: Secondary | ICD-10-CM

## 2015-09-09 HISTORY — DX: Other chronic pain: G89.29

## 2015-09-09 HISTORY — DX: Cervicalgia: M54.2

## 2015-09-09 LAB — CBC WITH DIFFERENTIAL/PLATELET
BASOS ABS: 0 10*3/uL (ref 0–0.1)
BASOS PCT: 0 %
EOS PCT: 0 %
Eosinophils Absolute: 0 10*3/uL (ref 0–0.7)
HCT: 34.4 % — ABNORMAL LOW (ref 35.0–47.0)
Hemoglobin: 11.7 g/dL — ABNORMAL LOW (ref 12.0–16.0)
Lymphocytes Relative: 5 %
Lymphs Abs: 1 10*3/uL (ref 1.0–3.6)
MCH: 28.2 pg (ref 26.0–34.0)
MCHC: 34 g/dL (ref 32.0–36.0)
MCV: 82.9 fL (ref 80.0–100.0)
MONO ABS: 1.3 10*3/uL — AB (ref 0.2–0.9)
Monocytes Relative: 7 %
Neutro Abs: 17.4 10*3/uL — ABNORMAL HIGH (ref 1.4–6.5)
Neutrophils Relative %: 88 %
PLATELETS: 261 10*3/uL (ref 150–440)
RBC: 4.15 MIL/uL (ref 3.80–5.20)
RDW: 14 % (ref 11.5–14.5)
WBC: 19.8 10*3/uL — ABNORMAL HIGH (ref 3.6–11.0)

## 2015-09-09 LAB — BASIC METABOLIC PANEL
ANION GAP: 8 (ref 5–15)
BUN: 20 mg/dL (ref 6–20)
CALCIUM: 8 mg/dL — AB (ref 8.9–10.3)
CO2: 22 mmol/L (ref 22–32)
CREATININE: 1.65 mg/dL — AB (ref 0.44–1.00)
Chloride: 101 mmol/L (ref 101–111)
GFR, EST AFRICAN AMERICAN: 42 mL/min — AB (ref >60)
GFR, EST NON AFRICAN AMERICAN: 37 mL/min — AB (ref >60)
GLUCOSE: 185 mg/dL — AB (ref 65–99)
Potassium: 3.2 mmol/L — ABNORMAL LOW (ref 3.5–5.1)
Sodium: 131 mmol/L — ABNORMAL LOW (ref 135–145)

## 2015-09-09 LAB — LACTIC ACID, PLASMA
LACTIC ACID, VENOUS: 2.7 mmol/L — AB (ref 0.5–2.0)
LACTIC ACID, VENOUS: 2.4 mmol/L — AB (ref 0.5–2.0)

## 2015-09-09 LAB — GLUCOSE, CAPILLARY: Glucose-Capillary: 244 mg/dL — ABNORMAL HIGH (ref 65–99)

## 2015-09-09 LAB — MRSA PCR SCREENING: MRSA by PCR: NEGATIVE

## 2015-09-09 MED ORDER — DICLOFENAC EPOLAMINE 1.3 % TD PTCH
1.0000 | MEDICATED_PATCH | Freq: Two times a day (BID) | TRANSDERMAL | Status: DC
  Administered 2015-09-09 – 2015-09-15 (×13): 1 via TRANSDERMAL
  Filled 2015-09-09 (×20): qty 1

## 2015-09-09 MED ORDER — NORTRIPTYLINE HCL 10 MG PO CAPS
30.0000 mg | ORAL_CAPSULE | Freq: Every day | ORAL | Status: DC
  Filled 2015-09-09: qty 3

## 2015-09-09 MED ORDER — CLINDAMYCIN PHOSPHATE 600 MG/50ML IV SOLN
600.0000 mg | Freq: Once | INTRAVENOUS | Status: AC
  Administered 2015-09-09: 600 mg via INTRAVENOUS
  Filled 2015-09-09: qty 50

## 2015-09-09 MED ORDER — NORTRIPTYLINE HCL 10 MG PO CAPS
30.0000 mg | ORAL_CAPSULE | Freq: Every day | ORAL | Status: DC
  Administered 2015-09-10 – 2015-09-16 (×7): 30 mg via ORAL
  Filled 2015-09-09 (×9): qty 3

## 2015-09-09 MED ORDER — SODIUM CHLORIDE 0.9 % IV BOLUS (SEPSIS)
1000.0000 mL | Freq: Once | INTRAVENOUS | Status: AC
  Administered 2015-09-09: 1000 mL via INTRAVENOUS

## 2015-09-09 MED ORDER — VANCOMYCIN HCL IN DEXTROSE 1-5 GM/200ML-% IV SOLN
1000.0000 mg | Freq: Once | INTRAVENOUS | Status: AC
  Administered 2015-09-09: 1000 mg via INTRAVENOUS
  Filled 2015-09-09 (×2): qty 200

## 2015-09-09 MED ORDER — OXYCODONE HCL 5 MG PO TABS
5.0000 mg | ORAL_TABLET | ORAL | Status: DC | PRN
  Administered 2015-09-09 – 2015-09-15 (×10): 5 mg via ORAL
  Filled 2015-09-09 (×12): qty 1

## 2015-09-09 MED ORDER — ACETAMINOPHEN 325 MG PO TABS
650.0000 mg | ORAL_TABLET | Freq: Four times a day (QID) | ORAL | Status: DC | PRN
  Administered 2015-09-09 – 2015-09-10 (×2): 650 mg via ORAL
  Filled 2015-09-09 (×2): qty 2

## 2015-09-09 MED ORDER — MODAFINIL 100 MG PO TABS
200.0000 mg | ORAL_TABLET | Freq: Every day | ORAL | Status: DC
  Administered 2015-09-10 – 2015-09-17 (×8): 200 mg via ORAL
  Filled 2015-09-09 (×8): qty 2

## 2015-09-09 MED ORDER — PRENATAL VITAMINS 0.8 MG PO TABS: ORAL_TABLET | Freq: Every day | ORAL | Status: DC

## 2015-09-09 MED ORDER — MODAFINIL 200 MG PO TABS: 200.0000 mg | ORAL_TABLET | Freq: Every day | ORAL | Status: DC

## 2015-09-09 MED ORDER — ENOXAPARIN SODIUM 40 MG/0.4ML ~~LOC~~ SOLN
40.0000 mg | SUBCUTANEOUS | Status: DC
Start: 2015-09-09 — End: 2015-09-17
  Administered 2015-09-09 – 2015-09-16 (×7): 40 mg via SUBCUTANEOUS
  Filled 2015-09-09 (×7): qty 0.4

## 2015-09-09 MED ORDER — FLUTICASONE PROPIONATE 50 MCG/ACT NA SUSP
1.0000 | Freq: Every day | NASAL | Status: DC | PRN
  Filled 2015-09-09: qty 16

## 2015-09-09 MED ORDER — SODIUM CHLORIDE 0.9% FLUSH
3.0000 mL | Freq: Two times a day (BID) | INTRAVENOUS | Status: DC
  Administered 2015-09-09 – 2015-09-17 (×16): 3 mL via INTRAVENOUS

## 2015-09-09 MED ORDER — MORPHINE SULFATE (PF) 2 MG/ML IV SOLN
2.0000 mg | INTRAVENOUS | Status: DC | PRN
  Administered 2015-09-13 – 2015-09-15 (×4): 2 mg via INTRAVENOUS
  Filled 2015-09-09 (×4): qty 1

## 2015-09-09 MED ORDER — BUTALBITAL-APAP-CAFFEINE 50-325-40 MG PO TABS
1.0000 | ORAL_TABLET | Freq: Four times a day (QID) | ORAL | Status: DC | PRN
  Administered 2015-09-10 – 2015-09-16 (×4): 1 via ORAL
  Filled 2015-09-09 (×4): qty 1

## 2015-09-09 MED ORDER — LEVOTHYROXINE SODIUM 88 MCG PO TABS
88.0000 ug | ORAL_TABLET | Freq: Every day | ORAL | Status: DC
  Administered 2015-09-10 – 2015-09-17 (×8): 88 ug via ORAL
  Filled 2015-09-09 (×8): qty 1

## 2015-09-09 MED ORDER — ACETAMINOPHEN 650 MG RE SUPP: 650.0000 mg | Freq: Four times a day (QID) | RECTAL | Status: DC | PRN

## 2015-09-09 MED ORDER — ONDANSETRON HCL 4 MG PO TABS
4.0000 mg | ORAL_TABLET | Freq: Three times a day (TID) | ORAL | Status: DC | PRN
  Administered 2015-09-13 – 2015-09-14 (×3): 4 mg via ORAL
  Filled 2015-09-09 (×3): qty 1

## 2015-09-09 MED ORDER — PREGABALIN 25 MG PO CAPS
50.0000 mg | ORAL_CAPSULE | Freq: Three times a day (TID) | ORAL | Status: DC
  Administered 2015-09-09 – 2015-09-17 (×23): 50 mg via ORAL
  Filled 2015-09-09 (×24): qty 2

## 2015-09-09 MED ORDER — CLONAZEPAM 0.5 MG PO TABS: 0.5000 mg | ORAL_TABLET | Freq: Two times a day (BID) | ORAL | Status: DC | PRN

## 2015-09-09 MED ORDER — SODIUM CHLORIDE 0.9 % IV SOLN
INTRAVENOUS | Status: DC
  Administered 2015-09-09 – 2015-09-12 (×5): via INTRAVENOUS

## 2015-09-09 MED ORDER — POTASSIUM CHLORIDE CRYS ER 20 MEQ PO TBCR: 40.0000 meq | EXTENDED_RELEASE_TABLET | Freq: Once | ORAL | Status: DC

## 2015-09-09 MED ORDER — VANCOMYCIN HCL IN DEXTROSE 1-5 GM/200ML-% IV SOLN
1000.0000 mg | INTRAVENOUS | Status: DC
  Administered 2015-09-10: 1000 mg via INTRAVENOUS
  Filled 2015-09-09 (×2): qty 200

## 2015-09-09 MED ORDER — POTASSIUM CHLORIDE CRYS ER 20 MEQ PO TBCR
40.0000 meq | EXTENDED_RELEASE_TABLET | Freq: Once | ORAL | Status: AC
  Administered 2015-09-09: 40 meq via ORAL
  Filled 2015-09-09: qty 2

## 2015-09-09 MED ORDER — PIPERACILLIN-TAZOBACTAM 3.375 G IVPB 30 MIN: 3.3750 g | Freq: Once | INTRAVENOUS | Status: DC

## 2015-09-09 MED ORDER — PIPERACILLIN-TAZOBACTAM 3.375 G IVPB
3.3750 g | Freq: Three times a day (TID) | INTRAVENOUS | Status: DC
  Administered 2015-09-09 – 2015-09-13 (×12): 3.375 g via INTRAVENOUS
  Filled 2015-09-09 (×15): qty 50

## 2015-09-09 MED ORDER — SODIUM CHLORIDE 0.9 % IV BOLUS (SEPSIS)
2000.0000 mL | Freq: Once | INTRAVENOUS | Status: AC
  Administered 2015-09-09: 2000 mL via INTRAVENOUS

## 2015-09-09 NOTE — Consult Note (Signed)
MEDICATION RELATED CONSULT NOTE - INITIAL   Pharmacy Consult for Electrolyte Management  Indication: hypokalemia  Allergies  Allergen Reactions  . Amlodipine Other (See Comments)    Reaction: extremities get swollen  . Melatonin Other (See Comments)    Reaction: Hallucinations   . Phenergan [Promethazine Hcl] Other (See Comments)    Reaction: Hallucinations  . Trazodone And Nefazodone Other (See Comments)    Reaction: Hallucination  . Atenolol Anxiety  . Nystatin Rash    Patient Measurements: Height: 5\' 3"  (160 cm) Weight: 195 lb (88.451 kg) IBW/kg (Calculated) : 52.4  Vital Signs: Temp: 97.6 F (36.4 C) (04/06 1318) Temp Source: Oral (04/06 1318) BP: 77/58 mmHg (04/06 1500) Pulse Rate: 95 (04/06 1500) Intake/Output from previous day:   Intake/Output from this shift: Total I/O In: 333.3 [I.V.:133.3; IV Piggyback:200] Out: -   Labs:  Recent Labs  09/09/15 0812  WBC 19.8*  HGB 11.7*  HCT 34.4*  PLT 261  CREATININE 1.65*   Estimated Creatinine Clearance: 45.4 mL/min (by C-G formula based on Cr of 1.65).   Microbiology: No results found for this or any previous visit (from the past 720 hour(s)).  Medical History: Past Medical History  Diagnosis Date  . Fibromyalgia   . Migraine   . Thyroid disease   . Migraine   . Chronic neck pain     Medications:  Scheduled:  . diclofenac  1 patch Transdermal BID  . enoxaparin (LOVENOX) injection  40 mg Subcutaneous Q24H  . [START ON 09/10/2015] levothyroxine  88 mcg Oral QAC breakfast  . modafinil  200 mg Oral Daily  . [START ON 09/10/2015] nortriptyline  30 mg Oral QHS  . piperacillin-tazobactam (ZOSYN)  IV  3.375 g Intravenous 3 times per day  . potassium chloride  40 mEq Oral Once  . pregabalin  50 mg Oral TID  . sodium chloride  1,000 mL Intravenous Once  . sodium chloride flush  3 mL Intravenous Q12H  . [START ON 09/10/2015] vancomycin  1,000 mg Intravenous Q24H    Assessment: KA is a 45yo female admitted  for sepsis 2/2 cellulitis. Pharmacy consulted to manage this patients electrolytes.  Plan:  4/6 K 3.2.  Will give KCl 40mEq PO x1 and will recheck K and Mag with AM labs.  Cy BlamerAllison K Birda Didonato 09/09/2015,3:28 PM

## 2015-09-09 NOTE — Progress Notes (Signed)
Pharmacy Antibiotic Note  Sandy Mitchell is a 45 y.o. female admitted on 09/09/2015 with sepsis and cellulitis.  Pharmacy has been consulted for Zosyn and vancomycin dosing. Per notes pt on Bactrim PTA.  Plan: Vancomycin 1 g IV x1 and Zosyn 3.375 g IV x1 ordered in the ED not charted as given. 10 hours after one time dose will start (stacked dose) Vancomycin 1000 mg IV every 24 hours.  Goal trough 15-20 mcg/mL. Zosyn 3.375g IV q8h (4 hour infusion).   Will hold off on ordering vanc trough; will follow renal function, anticipate it will improve (requiring dose adjustment). Ke 0.034, half life 20.4 h, Vd 47 L  Height: 5\' 3"  (160 cm) Weight: 195 lb (88.451 kg) IBW/kg (Calculated) : 52.4  Temp (24hrs), Avg:98.1 F (36.7 C), Min:97.6 F (36.4 C), Max:98.5 F (36.9 C)   Recent Labs Lab 09/09/15 0812  WBC 19.8*  CREATININE 1.65*    Estimated Creatinine Clearance: 45.4 mL/min (by C-G formula based on Cr of 1.65).    Allergies  Allergen Reactions  . Amlodipine Other (See Comments)    Reaction: extremities get swollen  . Melatonin Other (See Comments)    Reaction: Hallucinations   . Phenergan [Promethazine Hcl] Other (See Comments)    Reaction: Hallucinations  . Trazodone And Nefazodone Other (See Comments)    Reaction: Hallucination  . Atenolol Anxiety  . Nystatin Rash    Antimicrobials this admission: vanc 4/6 >>  Zosyn 4/6 >>   Dose adjustments this admission:   Microbiology results:   Thank you for allowing pharmacy to be a part of this patient's care.  Marty HeckWang, Birda Didonato L 09/09/2015 1:49 PM

## 2015-09-09 NOTE — Progress Notes (Signed)
Dr. Renae GlossWieting notified of critical lactic acid: 2.7. Acknowledged. No new orders. Cherice Glennie E 5:36 PM 09/09/2015

## 2015-09-09 NOTE — ED Provider Notes (Signed)
[facility] Emergency Department Provider Note  ____________________________________________  Time seen: 7:10 AM  I have reviewed the triage vital signs and the nursing notes.   HISTORY  Chief Complaint Cellulitis    HPI Sandy Mitchell is a 45 y.o. female who complains of pain swelling and redness of the right abdominal wall has been worsening over the past 3 days. She states that she initially had a little bit of a bug bite in the area and that it is progressively gotten worse and with the redness area expanding. She was seen in the ED yesterday, started on Bactrim due to a history of MRSA skin infection on the right thigh but is continued to worsen. She also has nausea and decreased oral intake.Denies dizziness chest pain or shortness of breath.     Past Medical History  Diagnosis Date  . Fibromyalgia   . Migraine   . Thyroid disease      There are no active problems to display for this patient.    Past Surgical History  Procedure Laterality Date  . Tubal ligation       Current Outpatient Rx  Name  Route  Sig  Dispense  Refill  . sulfamethoxazole-trimethoprim (BACTRIM DS,SEPTRA DS) 800-160 MG tablet   Oral   Take 1 tablet by mouth 2 (two) times daily.   20 tablet   0   . traMADol (ULTRAM) 50 MG tablet   Oral   Take 1 tablet (50 mg total) by mouth every 6 (six) hours as needed.   12 tablet   0   . Butalbital-APAP-Caffeine 50-325-40 MG capsule      Take 1 tablet at headache onset.         . clindamycin (CLEOCIN) 300 MG capsule   Oral   Take 1 capsule (300 mg total) by mouth 4 (four) times daily.   28 capsule   0   . FLECTOR 1.3 % PTCH            0     Dispense as written.   . fluticasone (FLONASE) 50 MCG/ACT nasal spray   Each Nare   Place into both nostrils daily.         Marland Kitchen gabapentin (NEURONTIN) 100 MG capsule            11   . levothyroxine (SYNTHROID, LEVOTHROID) 100 MCG tablet   Oral   Take 100 mcg by  mouth daily before breakfast.         . levothyroxine (SYNTHROID, LEVOTHROID) 88 MCG tablet   Oral   Take 88 mcg by mouth daily before breakfast.         . LYRICA 25 MG capsule   Oral   Take 25 mg by mouth 2 (two) times daily.       1     Dispense as written.   . modafinil (PROVIGIL) 200 MG tablet            0   . nortriptyline (PAMELOR) 10 MG capsule   Oral   Take 20 mg by mouth at bedtime.          . ondansetron (ZOFRAN-ODT) 4 MG disintegrating tablet   Oral   Take 4 mg by mouth every 8 (eight) hours as needed for nausea or vomiting.         . Prenatal Multivit-Min-Fe-FA (PRENATAL VITAMINS PO)   Oral   Take 1 tablet by mouth daily.  Allergies Amlodipine; Melatonin; Phenergan; Trazodone and nefazodone; Atenolol; and Nystatin   No family history on file.  Social History Social History  Substance Use Topics  . Smoking status: Former Games developermoker  . Smokeless tobacco: None  . Alcohol Use: No    Review of Systems  Constitutional:   Positive chills. No weight changes Eyes:   No vision changes.  ENT:   No sore throat. No rhinorrhea. Cardiovascular:   No chest pain. Respiratory:   No dyspnea or cough. Gastrointestinal:   Right-sided abdominal pain without vomiting and diarrhea.  No BRBPR or melena. Genitourinary:   Negative for dysuria or difficulty urinating. Musculoskeletal:   Negative for focal pain or swelling Skin:   Right abdominal wall redness as above  Neurological:   Negative for headaches, focal weakness or numbness.  10-point ROS otherwise negative.  ____________________________________________   PHYSICAL EXAM:  VITAL SIGNS: ED Triage Vitals  Enc Vitals Group     BP 09/09/15 0624 109/65 mmHg     Pulse --      Resp 09/09/15 0624 18     Temp 09/09/15 0624 98.5 F (36.9 C)     Temp Source 09/09/15 0624 Oral     SpO2 09/09/15 0624 96 %     Weight 09/09/15 0624 195 lb (88.451 kg)     Height 09/09/15 0624 5\' 3"  (1.6 m)      Head Cir --      Peak Flow --      Pain Score 09/09/15 0625 7     Pain Loc --      Pain Edu? --      Excl. in GC? --     Vital signs reviewed, nursing assessments reviewed.   Constitutional:   Alert and oriented. Uncomfortable.. Eyes:   No scleral icterus. No conjunctival pallor. PERRL. EOMI ENT   Head:   Normocephalic and atraumatic.   Nose:   No congestion/rhinnorhea. No septal hematoma   Mouth/Throat:   Dry mucous membrane, no pharyngeal erythema. No peritonsillar mass.    Neck:   No stridor. No SubQ emphysema. No meningismus. Hematological/Lymphatic/Immunilogical:   No cervical lymphadenopathy. Cardiovascular:   Tachycardia heart rate 125. Symmetric bilateral radial and DP pulses.  No murmurs.  Respiratory:   Normal respiratory effort without tachypnea nor retractions. Breath sounds are clear and equal bilaterally. No wheezes/rales/rhonchi. Gastrointestinal:   Soft and nontender to deep palpation. There is a large erythematous area on the right abdominal wall which is indurated warm to touch and tender. There is surgical skin marking Inc from yesterday and the erythema extends 1-2 cm beyond this in all directions. Not involving the umbilicus. Rash covers approximately 40% of the abdominal wall, limited to the right side.. Non distended. There is no CVA tenderness.  No rebound, rigidity, or guarding. Genitourinary:   deferred Musculoskeletal:   Nontender with normal range of motion in all extremities. No joint effusions.  No lower extremity tenderness.  No edema. Neurologic:   Normal speech and language.  CN 2-10 normal. Motor grossly intact. No gross focal neurologic deficits are appreciated.  Skin:    Skin is warm, dry and intact. Large abdominal wall erythematous rash as noted above.  No petechiae, purpura, or bullae. Psychiatric:   Mood and affect are normal. ____________________________________________    LABS (pertinent positives/negatives) (all labs ordered  are listed, but only abnormal results are displayed) Labs Reviewed  CBC WITH DIFFERENTIAL/PLATELET - Abnormal; Notable for the following:    WBC 19.8 (*)  Hemoglobin 11.7 (*)    HCT 34.4 (*)    Neutro Abs 17.4 (*)    Monocytes Absolute 1.3 (*)    All other components within normal limits  BASIC METABOLIC PANEL   ____________________________________________   EKG  Interpreted by me Sinus tachycardia rate 134, normal axis and intervals. Normal QRS and ST segments. LVH by voltage criteria in the high lateral leads with associated repolarization abnormality with scattered T-wave inversions. No evidence of regional acute ischemic changes.  ____________________________________________    RADIOLOGY    ____________________________________________   PROCEDURES CRITICAL CARE Performed by: Sharman Cheek   Total critical care time: 35 minutes  Critical care time was exclusive of separately billable procedures and treating other patients.  Critical care was necessary to treat or prevent imminent or life-threatening deterioration.  Critical care was time spent personally by me on the following activities: development of treatment plan with patient and/or surrogate as well as nursing, discussions with consultants, evaluation of patient's response to treatment, examination of patient, obtaining history from patient or surrogate, ordering and performing treatments and interventions, ordering and review of laboratory studies, ordering and review of radiographic studies, pulse oximetry and re-evaluation of patient's condition.   ____________________________________________   INITIAL IMPRESSION / ASSESSMENT AND PLAN / ED COURSE  Pertinent labs & imaging results that were available during my care of the patient were reviewed by me and considered in my medical decision making (see chart for details).  Patient presents with tachycardia chills enlarged cellulitis with outpatient  treatment failure with Bactrim. Suspect sepsis. IV fluids, IV clindamycin, check labs  ----------------------------------------- 9:56 AM on 09/09/2015 -----------------------------------------  Blood pressure stable, tachycardia is improving with IV fluids, we'll continue IV fluids. Plan for admission due to significant leukocytosis with a white count of almost 20,000 and acute renal insufficiency. Baseline creatinine is 0.6 according to the electronic medical record, currently today is 1.6 indicating some organ dysfunction in the setting of this infection, consistent with cellulitis and sepsis. Capillary refill is less than 2 seconds, lungs remain clear to auscultation bilaterally.      ____________________________________________   FINAL CLINICAL IMPRESSION(S) / ED DIAGNOSES  Final diagnoses:  Cellulitis of right abdominal wall  Sepsis    Sharman Cheek, MD 09/09/15 [phone]

## 2015-09-09 NOTE — Progress Notes (Signed)
eLink Physician-Brief Progress Note Patient Name: Deborha PaymentKelly Ostrander DOB: 04/02/1971 MRN: [id number]   Date of Service  09/09/2015  HPI/Events of Note  Hypotension - BP = 75/59.    eICU Interventions  Continue with fluid resusitation as already ordered.      Intervention Category Major Interventions: Hypotension - evaluation and management  Sommer,Steven Eugene 09/09/2015, 5:07 PM

## 2015-09-09 NOTE — ED Notes (Signed)
Patient presents with what she is saying is a bug bite to her RIGHT flank area; first appreciated on Monday. Patient drew a line around the perimeter on Monday and was advised to return if redness extended past line. (+) fever at home; tmax unknown.

## 2015-09-09 NOTE — H&P (Signed)
[facility] [place] [facility]   PATIENT NAME: Sandy Mitchell    MR#:  [id number]  DATE OF BIRTH:  06/13/1970  DATE OF ADMISSION:  09/09/2015  PRIMARY CARE PHYSICIAN: Eden LatheAnna [facility]   REQUESTING/REFERRING PHYSICIAN: Alfonse FlavorsPhilip Stafford  CHIEF COMPLAINT:   Chief Complaint  Patient presents with  . Cellulitis    HISTORY OF PRESENT ILLNESS:  Sandy Mitchell  is a 45 y.o. female with a known history of fibromyalgia, chronic neck pain. She presents to the ER again today with worsening cellulitis of the right abdomen. She stated it started off on Monday with a small spot and got bigger on Tuesday. On Tuesday night she came to the ER and was started on Bactrim. The swelling did get worse and worse and came into the ER for further evaluation. She's had abdominal pain severe when she moves around but when she still not so bad. She's had chills and hot feeling. In the ER, she was found to be hypotensive, tachycardic and elevated white count and hospitalist services were contacted for further evaluation.  PAST MEDICAL HISTORY:   Past Medical History  Diagnosis Date  . Fibromyalgia   . Migraine   . Thyroid disease   . Migraine   . Chronic neck pain     PAST SURGICAL HISTORY:   Past Surgical History  Procedure Laterality Date  . Tubal ligation      SOCIAL HISTORY:   Social History  Substance Use Topics  . Smoking status: Former Games developermoker  . Smokeless tobacco: Not on file  . Alcohol Use: No    FAMILY HISTORY:   Family History  Problem Relation Age of Onset  . Healthy Mother   . Kidney failure Father   . Hypertension Father   . Diabetes Father     DRUG ALLERGIES:   Allergies  Allergen Reactions  . Amlodipine Other (See Comments)    Reaction: extremities get swollen  . Melatonin Other (See Comments)    Reaction: Hallucinations   . Phenergan [Promethazine Hcl] Other (See Comments)    Reaction: Hallucinations  . Trazodone And Nefazodone  Other (See Comments)    Reaction: Hallucination  . Atenolol Anxiety  . Nystatin Rash    REVIEW OF SYSTEMS:  CONSTITUTIONAL: No fever, positive for weakness. Positive for chills and hot feeling. EYES: No blurred or double vision. Wears contacts and has dry eyes EARS, NOSE, AND THROAT: No tinnitus or ear pain. No sore throat RESPIRATORY: No cough, positive for shortness of breath, no wheezing or hemoptysis.  CARDIOVASCULAR: No chest pain, orthopnea, edema.  GASTROINTESTINAL: No nausea, vomiting, diarrhea. Positive for abdominal pain. No blood in bowel movements GENITOURINARY: No dysuria. Positive dark urine questionable blood ENDOCRINE: No polyuria, nocturia,  HEMATOLOGY: No anemia, easy bruising or bleeding SKIN: Rash right abdomen with erythema outlined by ER physician large area right side of the abdomen. Small area which looks like a bite in the center. MUSCULOSKELETAL: Positive for joint pain.   NEUROLOGIC: No tingling, numbness, weakness.  PSYCHIATRY: No anxiety or depression.   MEDICATIONS AT HOME:   Prior to Admission medications   Medication Sig Start Date End Date Taking? Authorizing Provider  butalbital-acetaminophen-caffeine (FIORICET, ESGIC) 50-325-40 MG tablet Take 1 tablet by mouth every 6 (six) hours as needed for headache.   Yes Historical Provider, MD  clonazePAM (KLONOPIN) 0.5 MG tablet Take 0.5 mg by mouth 2 (two) times daily as needed for anxiety.   Yes Historical Provider, MD  FLECTOR 1.3 %  PTCH Place 1 patch onto the skin 2 (two) times daily.  06/09/15  Yes Historical Provider, MD  fluticasone (FLONASE) 50 MCG/ACT nasal spray Place 1 spray into both nostrils daily as needed for allergies or rhinitis.    Yes Historical Provider, MD  levothyroxine (SYNTHROID, LEVOTHROID) 88 MCG tablet Take 88 mcg by mouth daily before breakfast.   Yes Historical Provider, MD  modafinil (PROVIGIL) 200 MG tablet Take 200 mg by mouth daily.  03/25/15  Yes Historical Provider, MD   nortriptyline (PAMELOR) 10 MG capsule Take 30 mg by mouth daily.    Yes Historical Provider, MD  ondansetron (ZOFRAN) 4 MG tablet Take 4 mg by mouth every 8 (eight) hours as needed for nausea or vomiting.   Yes Historical Provider, MD  pregabalin (LYRICA) 50 MG capsule Take 50 mg by mouth 3 (three) times daily.   Yes Historical Provider, MD  Prenatal Multivit-Min-Fe-FA (PRENATAL VITAMINS PO) Take 1 tablet by mouth daily.   Yes Historical Provider, MD  sulfamethoxazole-trimethoprim (BACTRIM DS,SEPTRA DS) 800-160 MG tablet Take 1 tablet by mouth 2 (two) times daily. 09/07/15  Yes Cari B Triplett, FNP  tiZANidine (ZANAFLEX) 2 MG tablet Take 2-4 mg by mouth daily as needed for muscle spasms.  08/04/15  Yes Historical Provider, MD  traMADol (ULTRAM) 50 MG tablet Take 1 tablet (50 mg total) by mouth every 6 (six) hours as needed. Patient taking differently: Take 50 mg by mouth every 6 (six) hours as needed for moderate pain.  09/07/15  Yes Cari B Triplett, FNP      VITAL SIGNS:  Blood pressure 80/55, pulse 98, temperature 98.5 F (36.9 C), temperature source Oral, resp. rate 16, height  (1.6 m), weight 88.451 kg (195 lb), last menstrual period 09/03/2015, SpO2 93 %.  PHYSICAL EXAMINATION:  GENERAL:  45 y.o.-year-old patient lying in the bed with no acute distress.  EYES: Pupils equal, round, reactive to light and accommodation. No scleral icterus. Extraocular muscles intact.  HEENT: Head atraumatic, normocephalic. Oropharynx and nasopharynx clear.  NECK:  Supple, no jugular venous distention. No thyroid enlargement, no tenderness.  LUNGS: Normal breath sounds bilaterally, no wheezing, rales,rhonchi or crepitation. No use of accessory muscles of respiration.  CARDIOVASCULAR: S1, S2 normal. No murmurs, rubs, or gallops.  ABDOMEN: Soft, right abdominal tenderness, nondistended. Bowel sounds present. No organomegaly or mass.  EXTREMITIES: No pedal edema, cyanosis, or clubbing.  NEUROLOGIC: Cranial  nerves II through XII are intact. Muscle strength 5/5 in all extremities. Sensation intact. Gait not checked.  PSYCHIATRIC: The patient is alert and oriented x 3.  SKIN: Large area of erythema right abdomen with this area in the center which looks like a bite mark from an insect  LABORATORY PANEL:   CBC  Recent Labs Lab 09/09/15 0812  WBC 19.8*  HGB 11.7*  HCT 34.4*  PLT 261   ------------------------------------------------------------------------------------------------------------------  Chemistries   Recent Labs Lab 09/09/15 0812  NA 131*  K 3.2*  CL 101  CO2 22  GLUCOSE 185*  BUN 20  CREATININE 1.65*  CALCIUM 8.0*   ------------------------------------------------------------------------------------------------------------------   EKG:   Sinus tachycardia 134 bpm Q waves inferiorly  IMPRESSION AND PLAN:   1. Severe sepsis with hypotension, leukocytosis, tachycardia, large right sided abdominal wall cellulitis and acute renal failure. With the patient's hypotension we'll give a third bolus of fluid wide open and normal saline at 125 cc per hour. Patient's normal blood pressures on the lower side anyway at 110 systolic. Stop Zanaflex, which can be  contributing to the hypotension. Aggressive with antibiotics with vancomycin and Zosyn for now. Hopefully will not need pressors. 2. Acute renal failure. Could be ATN from hypotension and sepsis versus Bactrim related. Continue IV fluid hydration  3. Hypothyroidism unspecified. Continue levothyroxine. 4. Impaired fasting glucose check a hemoglobin A1c 5. Hyponatremia could be Bactrim related or dehydration related. Continue IV fluids 6. Fibromyalgia, chronic neck pain, migraines. Continue her usual medications except for the Zanaflex.  All the records are reviewed and case discussed with ED provider. Management plans discussed with the patient, and she is in agreement.  CODE STATUS: Full code  TOTAL TIME TAKING CARE  OF THIS PATIENT: 50 minutes, patient will be admitted to the critical care stepdown unit secondary to hypotension and severe sepsis and needs close clinical monitoring.Renae Gloss, Duke Salvia.D on 09/09/2015 at 12:31 PM  Between 7am to 6pm - Pager - [phone]  After 6pm call admission pager [phone]  [facility] Office  [phone]  CC: Primary care physician; Dr. Eden Lathe at [facility]

## 2015-09-09 NOTE — Consult Note (Signed)
[facility] Pulmonary Medicine Consultation      Date: 09/09/2015,   MRN# [id number] Sandy Mitchell Sep 06, 1970 Code Status:     Code Status Orders        Start     Ordered   09/09/15 1056  Full code   Continuous     09/09/15 1055    Code Status History    Date Active Date Inactive Code Status Order ID Comments User Context   This patient has a current code status but no historical code status.     Hosp day:@LENGTHOFSTAYDAYS @ Referring MD: @     PCP:      AdmissionWeight: 195 lb (88.451 kg)                 CurrentWeight: 195 lb (88.451 kg) Sandy Mitchell is a 45 y.o. old female seen in consultation for infection at the request of dr. Hilton Sinclair     CHIEF COMPLAINT:   Chest wall pain   HISTORY OF PRESENT ILLNESS  45 y.o. female with a known history of fibromyalgia, chronic neck pain.  -She presents to the ER today with rt sided chest wall pain going on for several days -patient dx with worsening cellulitis of the right abdomen.  -She stated it started off on Monday with a small spot and got bigger on Tuesday.  -On Tuesday night she came to the ER and was started on Bactrim.  -The swelling did get worse and worse and came into the ER for further evaluation.  -She's had abdominal pain severe when she moves around but when she still not so bad. She's had chills and hot feeling.  -she was found to be hypotensive, tachycardic and elevated white count   Patient is alert and awale, NAD, patient states that her nL bp is very low at baseline   PAST MEDICAL HISTORY   Past Medical History  Diagnosis Date  . Fibromyalgia   . Migraine   . Thyroid disease   . Migraine   . Chronic neck pain      SURGICAL HISTORY   Past Surgical History  Procedure Laterality Date  . Tubal ligation       FAMILY HISTORY   Family History  Problem Relation Age of Onset  . Healthy Mother   . Kidney failure Father   . Hypertension Father   . Diabetes Father      SOCIAL  HISTORY   Social History  Substance Use Topics  . Smoking status: Former Games developer  . Smokeless tobacco: None  . Alcohol Use: No     MEDICATIONS    Home Medication:  No current outpatient prescriptions on file.  Current Medication:  Current facility-administered medications:  .  0.9 %  sodium chloride infusion, , Intravenous, Continuous, [name], MD, Last Rate: 125 mL/hr at 09/09/15 1356 .  acetaminophen (TYLENOL) tablet 650 mg, 650 mg, Oral, Q6H PRN, 650 mg at 09/09/15 1522 **OR** acetaminophen (TYLENOL) suppository 650 mg, 650 mg, Rectal, Q6H PRN, [name], MD .  butalbital-acetaminophen-caffeine (FIORICET, ESGIC) 50-325-40 MG per tablet 1 tablet, 1 tablet, Oral, Q6H PRN, [name], MD .  clonazePAM Scarlette Calico) tablet 0.5 mg, 0.5 mg, Oral, BID PRN, [name], MD .  diclofenac (FLECTOR) 1.3 % 1 patch, 1 patch, Transdermal, BID, [name], MD, 1 patch at 09/09/15 1353 .  enoxaparin (LOVENOX) injection 40 mg, 40 mg, Subcutaneous, Q24H, [name], MD, 40 mg at 09/09/15 1521 .  fluticasone (FLONASE) 50 MCG/ACT nasal spray 1 spray, 1 spray,  Each Nare, Daily PRN, [name], MD .  Melene Muller ON 09/10/2015] levothyroxine (SYNTHROID, LEVOTHROID) tablet 88 mcg, 88 mcg, Oral, QAC breakfast, [name], MD .  modafinil (PROVIGIL) tablet 200 mg, 200 mg, Oral, Daily, [name], MD, 200 mg at 09/09/15 1354 .  morphine 2 MG/ML injection 2 mg, 2 mg, Intravenous, Q4H PRN, [name], MD .  Melene Muller ON 09/10/2015] nortriptyline (PAMELOR) capsule 30 mg, 30 mg, Oral, QHS, Cy Blamer, RPH .  ondansetron [facility]) tablet 4 mg, 4 mg, Oral, Q8H PRN, [name], MD .  oxyCODONE (Oxy IR/ROXICODONE) immediate release tablet 5 mg, 5 mg, Oral, Q4H PRN, [name], MD .  piperacillin-tazobactam (ZOSYN) IVPB 3.375 g, 3.375 g, Intravenous, 3 times per day, [name], MD, 3.375 g at 09/09/15 1522 .  pregabalin (LYRICA) capsule 50 mg, 50 mg, Oral,  TID, [name], MD, 50 mg at 09/09/15 1521 .  sodium chloride 0.9 % bolus 1,000 mL, 1,000 mL, Intravenous, Once, [name], MD, 1,000 mL at 09/09/15 1407 .  sodium chloride 0.9 % bolus 1,000 mL, 1,000 mL, Intravenous, Once, [name], MD .  sodium chloride flush (NS) 0.9 % injection 3 mL, 3 mL, Intravenous, Q12H, [name], MD, 3 mL at 09/09/15 1523 .  [START ON 09/10/2015] vancomycin (VANCOCIN) IVPB 1000 mg/200 mL premix, 1,000 mg, Intravenous, Q24H, [name], MD    ALLERGIES   Amlodipine; Melatonin; Phenergan; Trazodone and nefazodone; Atenolol; and Nystatin     REVIEW OF SYSTEMS   Review of Systems  Constitutional: Positive for fever, chills and malaise/fatigue. Negative for weight loss and diaphoresis.  HENT: Negative for congestion and hearing loss.   Eyes: Negative for blurred vision and double vision.  Respiratory: Negative for cough, hemoptysis, sputum production, shortness of breath and wheezing.   Cardiovascular: Negative for chest pain, palpitations and orthopnea.       Rt side chest wall/abd pain swelling   Gastrointestinal: Negative for heartburn, nausea, vomiting, abdominal pain, diarrhea, constipation and blood in stool.  Genitourinary: Negative for dysuria and urgency.  Musculoskeletal: Positive for myalgias and joint pain. Negative for neck pain.  Skin: Negative for rash.  Neurological: Negative for dizziness, tingling, tremors, weakness and headaches.  Endo/Heme/Allergies: Does not bruise/bleed easily.  Psychiatric/Behavioral: Negative for depression, suicidal ideas and substance abuse.  All other systems reviewed and are negative.    VS: BP 77/58 mmHg  Pulse 95  Temp(Src) 97.6 F (36.4 C) (Oral)  Resp 15  Ht  (1.6 m)  Wt 195 lb (88.451 kg)  BMI 34.55 kg/m2  SpO2 97%  LMP 09/03/2015     PHYSICAL EXAM  Physical Exam  Constitutional: She is oriented to person, place, and time. She appears well-developed and  well-nourished. No distress.  HENT:  Head: Normocephalic and atraumatic.  Mouth/Throat: No oropharyngeal exudate.  Eyes: EOM are normal. Pupils are equal, round, and reactive to light. No scleral icterus.  Neck: Normal range of motion. Neck supple.  Cardiovascular: Normal rate, regular rhythm and normal heart sounds.   No murmur heard. Pulmonary/Chest: No stridor. No respiratory distress. She has no wheezes.  Abdominal: Soft. Bowel sounds are normal. She exhibits no distension. There is no tenderness. There is no rebound.  Musculoskeletal: Normal range of motion. She exhibits no edema.  Rt side chest wall erythematous/tenderness to touch  Neurological: She is alert and oriented to person, place, and time. She displays normal reflexes. Coordination normal.  Skin: Skin is warm. She is not diaphoretic.  Psychiatric: She has a  normal mood and affect.        LABS    Recent Labs     09/09/15  0812  HGB  11.7*  HCT  34.4*  MCV  82.9  WBC  19.8*  BUN  20  CREATININE  1.65*  GLUCOSE  185*  CALCIUM  8.0*  ,    No results for input(s): PH in the last 72 hours.  Invalid input(s): PCO2, PO2, BASEEXCESS, BASEDEFICITE, TFT    CULTURE RESULTS   Recent Results (from the past 240 hour(s))  MRSA PCR Screening     Status: None   Collection Time: 09/09/15  2:11 PM  Result Value Ref Range Status   MRSA by PCR NEGATIVE NEGATIVE Final    Comment:        The GeneXpert MRSA Assay (FDA approved for NASAL specimens only), is one component of a comprehensive MRSA colonization surveillance program. It is not intended to diagnose MRSA infection nor to guide or monitor treatment for MRSA infections.           IMAGING    Dg Cervical Spine Complete  09/04/2015  CLINICAL DATA:  Left neck and shoulder pain for 2 months. No known injury EXAM: CERVICAL SPINE - COMPLETE 4+ VIEW COMPARISON:  None. FINDINGS: There is no evidence of cervical spine fracture or prevertebral soft tissue  swelling. Alignment is normal. Mild anterior endplate osteophyte formation is seen at C5-6 without significant disc space narrowing. No other significant bone abnormality identified. IMPRESSION: No acute findings. Mild vertebral endplate osteophyte formation at C5-6. Electronically Signed   By: Myles RosenthalJohn  Stahl M.D.   On: 09/04/2015 09:07        ASSESSMENT/PLAN   45 yo white female with abd wall cellulitis with sepsis  1.check LA 2.IVF-patient given 2 L [place] will give 2 more Liters of IVF's 3. Keep MAP>60 4.no need for central line at this moment-patient is asking if really needed 5.continue Iv abx as prescribed    I have personally obtained a history, examined the patient, evaluated laboratory and independently reviewed imaging results, formulated the assessment and plan and placed orders.  The Patient requires high complexity decision making for assessment and support, frequent evaluation and titration of therapies, application of advanced monitoring technologies and extensive interpretation of multiple databases.    Patient satisfied with Plan of action and management. All questions answered  Lucie LeatherKurian David Makyi Ledo, M.D.  [facility]  Medical Director [facility] Medical Director [facility]

## 2015-09-10 ENCOUNTER — Encounter: Payer: Self-pay | Admitting: Radiology

## 2015-09-10 ENCOUNTER — Inpatient Hospital Stay: Payer: 59

## 2015-09-10 DIAGNOSIS — L03311 Cellulitis of abdominal wall: Secondary | ICD-10-CM | POA: Insufficient documentation

## 2015-09-10 LAB — CBC
HCT: 27.4 % — ABNORMAL LOW (ref 35.0–47.0)
HEMOGLOBIN: 9.4 g/dL — AB (ref 12.0–16.0)
MCH: 28.5 pg (ref 26.0–34.0)
MCHC: 34.2 g/dL (ref 32.0–36.0)
MCV: 83.2 fL (ref 80.0–100.0)
Platelets: 179 10*3/uL (ref 150–440)
RBC: 3.29 MIL/uL — AB (ref 3.80–5.20)
RDW: 14.2 % (ref 11.5–14.5)
WBC: 11 10*3/uL (ref 3.6–11.0)

## 2015-09-10 LAB — BASIC METABOLIC PANEL
ANION GAP: 2 — AB (ref 5–15)
BUN: 14 mg/dL (ref 6–20)
CALCIUM: 6.5 mg/dL — AB (ref 8.9–10.3)
CO2: 17 mmol/L — ABNORMAL LOW (ref 22–32)
Chloride: 114 mmol/L — ABNORMAL HIGH (ref 101–111)
Creatinine, Ser: 0.75 mg/dL (ref 0.44–1.00)
Glucose, Bld: 113 mg/dL — ABNORMAL HIGH (ref 65–99)
POTASSIUM: 3.5 mmol/L (ref 3.5–5.1)
Sodium: 133 mmol/L — ABNORMAL LOW (ref 135–145)

## 2015-09-10 LAB — PREGNANCY, URINE: PREG TEST UR: NEGATIVE

## 2015-09-10 LAB — MAGNESIUM: MAGNESIUM: 1.6 mg/dL — AB (ref 1.7–2.4)

## 2015-09-10 LAB — LACTIC ACID, PLASMA: LACTIC ACID, VENOUS: 1.9 mmol/L (ref 0.5–2.0)

## 2015-09-10 LAB — HEMOGLOBIN A1C: HEMOGLOBIN A1C: 5.8 % (ref 4.0–6.0)

## 2015-09-10 MED ORDER — ALBUMIN HUMAN 25 % IV SOLN
25.0000 g | Freq: Once | INTRAVENOUS | Status: AC
  Administered 2015-09-10: 25 g via INTRAVENOUS
  Filled 2015-09-10: qty 100

## 2015-09-10 MED ORDER — VANCOMYCIN HCL IN DEXTROSE 1-5 GM/200ML-% IV SOLN
1000.0000 mg | Freq: Three times a day (TID) | INTRAVENOUS | Status: DC
  Administered 2015-09-10 – 2015-09-13 (×10): 1000 mg via INTRAVENOUS
  Filled 2015-09-10 (×11): qty 200

## 2015-09-10 MED ORDER — VANCOMYCIN HCL 10 G IV SOLR
1500.0000 mg | Freq: Two times a day (BID) | INTRAVENOUS | Status: DC
  Filled 2015-09-10 (×2): qty 1500

## 2015-09-10 MED ORDER — SODIUM CHLORIDE 0.9 % IV BOLUS (SEPSIS)
1000.0000 mL | INTRAVENOUS | Status: DC | PRN
  Administered 2015-09-10 (×3): 1000 mL via INTRAVENOUS
  Filled 2015-09-10 (×3): qty 1000

## 2015-09-10 MED ORDER — IOPAMIDOL (ISOVUE-300) INJECTION 61%
100.0000 mL | Freq: Once | INTRAVENOUS | Status: AC | PRN
  Administered 2015-09-10: 100 mL via INTRAVENOUS

## 2015-09-10 MED ORDER — MAGNESIUM SULFATE 2 GM/50ML IV SOLN
2.0000 g | Freq: Once | INTRAVENOUS | Status: AC
Start: 2015-09-10 — End: 2015-09-10
  Administered 2015-09-10: 2 g via INTRAVENOUS
  Filled 2015-09-10: qty 50

## 2015-09-10 MED ORDER — DIATRIZOATE MEGLUMINE & SODIUM 66-10 % PO SOLN
15.0000 mL | ORAL | Status: AC
  Filled 2015-09-10 (×2): qty 30

## 2015-09-10 MED ORDER — POTASSIUM CHLORIDE CRYS ER 20 MEQ PO TBCR
40.0000 meq | EXTENDED_RELEASE_TABLET | Freq: Once | ORAL | Status: AC
  Administered 2015-09-10: 40 meq via ORAL
  Filled 2015-09-10: qty 2

## 2015-09-10 MED ORDER — GUAIFENESIN-DM 100-10 MG/5ML PO SYRP
5.0000 mL | ORAL_SOLUTION | ORAL | Status: DC | PRN
  Administered 2015-09-10 – 2015-09-12 (×6): 5 mL via ORAL
  Filled 2015-09-10 (×6): qty 5

## 2015-09-10 NOTE — Progress Notes (Signed)
Patient's O2 sats dropping to low 80s while sleeping; placed on 2LNC. spO2 98-100% at this time. Will continue to monitor.

## 2015-09-10 NOTE — Progress Notes (Signed)
[facility] [place] [facility]   PATIENT NAME: Sandy PaymentKelly Mudgett    MR#:  [id number]  DATE OF BIRTH:  05/11/1971  SUBJECTIVE:    Patient continues to have some pain at area of cellulitis. She feels that it has improved.  REVIEW OF SYSTEMS:    Review of Systems  Constitutional: Negative for fever, chills and malaise/fatigue.  HENT: Negative for ear discharge, ear pain, hearing loss, nosebleeds and sore throat.   Eyes: Negative for blurred vision and pain.  Respiratory: Negative for cough, hemoptysis, shortness of breath and wheezing.   Cardiovascular: Negative for chest pain, palpitations and leg swelling.  Gastrointestinal: Negative for nausea, vomiting, abdominal pain, diarrhea and blood in stool.  Genitourinary: Negative for dysuria.  Musculoskeletal: Negative for back pain.  Skin:       Abdominal cellulitis  Neurological: Negative for dizziness, tremors, speech change, focal weakness, seizures and headaches.  Endo/Heme/Allergies: Does not bruise/bleed easily.  Psychiatric/Behavioral: Negative for depression, suicidal ideas and hallucinations.    Tolerating Diet:yes      DRUG ALLERGIES:   Allergies  Allergen Reactions  . Amlodipine Other (See Comments)    Reaction: extremities get swollen  . Melatonin Other (See Comments)    Reaction: Hallucinations   . Phenergan [Promethazine Hcl] Other (See Comments)    Reaction: Hallucinations  . Trazodone And Nefazodone Other (See Comments)    Reaction: Hallucination  . Atenolol Anxiety  . Nystatin Rash    VITALS:  Blood pressure 93/64, pulse 102, temperature 98 F (36.7 C), temperature source Oral, resp. rate 21, height 5\' 3"  (1.6 m), weight 88.451 kg (195 lb), last menstrual period 09/03/2015, SpO2 100 %.  PHYSICAL EXAMINATION:   Physical Exam  Constitutional: She is oriented to person, place, and time and well-developed, well-nourished, and in no distress. No distress.  HENT:  Head:  Normocephalic.  Eyes: No scleral icterus.  Neck: Normal range of motion. Neck supple. No JVD present. No tracheal deviation present.  Cardiovascular: Normal rate, regular rhythm and normal heart sounds.  Exam reveals no gallop and no friction rub.   No murmur heard. Pulmonary/Chest: Effort normal and breath sounds normal. No respiratory distress. She has no wheezes. She has no rales. She exhibits no tenderness.  Abdominal: Soft. Bowel sounds are normal. She exhibits no distension and no mass. There is no tenderness. There is no rebound and no guarding.  Musculoskeletal: Normal range of motion. She exhibits no edema.  Neurological: She is alert and oriented to person, place, and time.  Skin: Skin is warm. No rash noted. No erythema.  Abdominal wall cellulitis on the right side with erythema and warmness with significant tenderness to palpation. It does seem to have improved since admission looking at the marked lines.  Psychiatric: Affect and judgment normal.      LABORATORY PANEL:   CBC  Recent Labs Lab 09/10/15 0550  WBC 11.0  HGB 9.4*  HCT 27.4*  PLT 179   ------------------------------------------------------------------------------------------------------------------  Chemistries   Recent Labs Lab 09/10/15 0550  NA 133*  K 3.5  CL 114*  CO2 17*  GLUCOSE 113*  BUN 14  CREATININE 0.75  CALCIUM 6.5*  MG 1.6*   ------------------------------------------------------------------------------------------------------------------  Cardiac Enzymes No results for input(s): TROPONINI in the last 168 hours. ------------------------------------------------------------------------------------------------------------------  RADIOLOGY:  No results found.   ASSESSMENT AND PLAN:    45 y/o female with fibromyalgia here with sepsis and abdominal wall cellulitis.  1. Abdominal wall cellulitis extending to chest wall:  Order  CT scan of abdomen to evaluate for  absces/necrotizing infection. Continue ZOSYN and VANCOMYCIN. Appreciate surgery evaluation. 2. SEVERE Sepsis: This is from abdominal wall cellulitis. She presented with hypotension, tachycardia. She still has hypotension. Continue IV fluids and continue to monitor and stepdown unit. No indication for pressors at this time. Lactic acid is improving.   3. Acute renal failure: This is due to hypovolemia. Creatinine has normalized with IV fluids.  4. Hyponatremia: This is due to hypovolemia/dehydration. Sodium has improved with IV fluids.  5. Hypothyroidism: Continue Synthroid.  6. Hypomagnesemia: Replete and recheck in a.m.  7. Fibromyalgia: Zanaflex has been discontinued as this can cause hypotension. Continue all other medications.  Management plans discussed with the patient and she is in agreement.  CODE STATUS: FULL  CRITICAL CARETOTAL TIME TAKING CARE OF THIS PATIENT: 30 minutes.  Patient still in step down with hypotension and severe sepsis   POSSIBLE D/C 3-4 days, DEPENDING ON CLINICAL CONDITION.   Amaliya Whitelaw M.D on 09/10/2015 at 12:03 PM  Between 7am to 6pm - Pager - [phone] After 6pm go to [email or web address] - password [email or web address]  [facility]  Office  [phone]  CC: Primary care physician; Default, Provider, MD  Note: This dictation was prepared with Dragon dictation along with smaller phrase technology. Any transcriptional errors that result from this process are unintentional.

## 2015-09-10 NOTE — Progress Notes (Signed)
Pharmacy Antibiotic Note  Sandy Mitchell is a 45 y.o. female admitted on 09/09/2015 with sepsis and cellulitis.  Pharmacy has been consulted for Zosyn and vancomycin dosing. Per notes pt on Bactrim PTA.  Plan:  Vancomycin 1000 mg IV every 8 hours.  Goal trough 15-20 mcg/mL. Zosyn 3.375g IV q8h (4 hour infusion).   Adjusting patient's dose as her renal function has improved significantly with fluids (Scr 1.65-->0.75). Ke 0.087, half life 7.97 h, Vd 61.9 L Cpk= 28.45, Ctr= 15.47  Will check trough prior to 5th dose @1930  on 4/8.  Height: 5\' 3"  (160 cm) Weight: 195 lb (88.451 kg) IBW/kg (Calculated) : 52.4  Temp (24hrs), Avg:97.8 F (36.6 C), Min:97.4 F (36.3 C), Max:98.1 F (36.7 C)   Recent Labs Lab 09/09/15 0812 09/09/15 1640 09/09/15 1910 09/10/15 0221 09/10/15 0550  WBC 19.8*  --   --   --  11.0  CREATININE 1.65*  --   --   --  0.75  LATICACIDVEN  --  2.7* 2.4* 1.9  --     Estimated Creatinine Clearance: 93.6 mL/min (by C-G formula based on Cr of 0.75).    Allergies  Allergen Reactions  . Amlodipine Other (See Comments)    Reaction: extremities get swollen  . Melatonin Other (See Comments)    Reaction: Hallucinations   . Phenergan [Promethazine Hcl] Other (See Comments)    Reaction: Hallucinations  . Trazodone And Nefazodone Other (See Comments)    Reaction: Hallucination  . Atenolol Anxiety  . Nystatin Rash    Antimicrobials this admission: Vanc 4/6 >>  Zosyn 4/6 >>   Dose adjustments this admission:   Microbiology results: None  Thank you for allowing pharmacy to be a part of this patient's care.  Sandy Mitchell 09/10/2015 11:35 AM

## 2015-09-10 NOTE — Progress Notes (Signed)
Patient went for CT of abdomen and pelvis and results are not back yet.  RN has held lovenox per Dr. Everlene FarrierPabon due to possible surgery based off of pending CT results.  Patient drowsy but A&O. Denies pain. VSS.

## 2015-09-10 NOTE — Progress Notes (Signed)
Dr. Everlene FarrierPabon present and RN discussed with MD the need to hold lovenox.  MD stated to hold lovenox for now until CT scan results of abdomen.

## 2015-09-10 NOTE — Care Management (Signed)
Patient admitted to icu stepdown for severe sepsis from abdominal cellulitis. Outpatient attempt to resolve symptoms failed.  Prior to this admission she was able to perform her own adls. Systolic blood pressures remain < 90 and receiving IVF at 125 cc/hr.

## 2015-09-10 NOTE — Progress Notes (Signed)
RN made Dr. Belia HemanKasa aware of increased swelling and firmness to right flank including ridged area.  RN asked MD for surgical consult.  MD gave order for consult to surgery.

## 2015-09-10 NOTE — Consult Note (Signed)
[facility] Consultation      Date: 09/10/2015,   MRN# [id number] Sandy Mitchell [birth year] Code Status:     Code Status Orders        Start     Ordered   09/09/15 1056  Full code   Continuous     09/09/15 1055    Code Status History    Date Active Date Inactive Code Status Order ID Comments User Context   This patient has a current code status but no historical code status.     Hosp day:@LENGTHOFSTAYDAYS @ Referring MD: @     PCP:      AdmissionWeight: 195 lb (88.451 kg)                 CurrentWeight: 195 lb (88.451 kg) Sandy Mitchell is a 45 y.o. old female seen in consultation for infection at the request of dr. Hilton Sinclair     CHIEF COMPLAINT:   Chest wall pain   HISTORY OF PRESENT ILLNESS  Resting comfortably today NAD LA 2.7>>>>1.9 given approx 4  liters NS   MEDICATIONS    Home Medication:  No current outpatient prescriptions on file.  Current Medication:  Current facility-administered medications:  .  0.9 %  sodium chloride infusion, , Intravenous, Continuous, [name], MD, Last Rate: 125 mL/hr at 09/10/15 0500 .  acetaminophen (TYLENOL) tablet 650 mg, 650 mg, Oral, Q6H PRN, 650 mg at 09/10/15 0030 **OR** acetaminophen (TYLENOL) suppository 650 mg, 650 mg, Rectal, Q6H PRN, [name], MD .  butalbital-acetaminophen-caffeine (FIORICET, ESGIC) [phone] MG per tablet 1 tablet, 1 tablet, Oral, Q6H PRN, [name], MD, 1 tablet at 09/10/15 0245 .  clonazePAM (KLONOPIN) tablet 0.5 mg, 0.5 mg, Oral, BID PRN, [name], MD .  diclofenac (FLECTOR) 1.3 % 1 patch, 1 patch, Transdermal, BID, [name], MD, 1 patch at 09/09/15 2130 .  enoxaparin (LOVENOX) injection 40 mg, 40 mg, Subcutaneous, Q24H, [name], MD, 40 mg at 09/09/15 1521 .  fluticasone (FLONASE) 50 MCG/ACT nasal spray 1 spray, 1 spray, Each Nare, Daily PRN, [name], MD .  guaiFENesin-dextromethorphan (ROBITUSSIN DM) 100-10 MG/5ML  syrup 5 mL, 5 mL, Oral, Q4H PRN, [name], MD .  levothyroxine (SYNTHROID, LEVOTHROID) tablet 88 mcg, 88 mcg, Oral, QAC breakfast, [name], MD .  modafinil (PROVIGIL) tablet 200 mg, 200 mg, Oral, Daily, [name], MD, 200 mg at 09/09/15 1354 .  morphine 2 MG/ML injection 2 mg, 2 mg, Intravenous, Q4H PRN, [name], MD .  nortriptyline (PAMELOR) capsule 30 mg, 30 mg, Oral, QHS, Cy Blamer, RPH .  ondansetron [facility]) tablet 4 mg, 4 mg, Oral, Q8H PRN, [name], MD .  oxyCODONE (Oxy IR/ROXICODONE) immediate release tablet 5 mg, 5 mg, Oral, Q4H PRN, [name], MD, 5 mg at 09/09/15 1853 .  piperacillin-tazobactam (ZOSYN) IVPB 3.375 g, 3.375 g, Intravenous, 3 times per day, [name], MD, 3.375 g at 09/10/15 0600 .  pregabalin (LYRICA) capsule 50 mg, 50 mg, Oral, TID, [name], MD, 50 mg at 09/09/15 2130 .  sodium chloride 0.9 % bolus 1,000 mL, 1,000 mL, Intravenous, PRN, Oralia Manis, MD, 1,000 mL at 09/10/15 0520 .  sodium chloride flush (NS) 0.9 % injection 3 mL, 3 mL, Intravenous, Q12H, [name], MD, 3 mL at 09/09/15 2130 .  vancomycin (VANCOCIN) IVPB 1000 mg/200 mL premix, 1,000 mg, Intravenous, Q24H, [name], MD, 1,000 mg at 09/10/15 0030    ALLERGIES   Amlodipine; Melatonin; Phenergan; Trazodone and nefazodone; Atenolol; and  Nystatin     REVIEW OF SYSTEMS   Review of Systems  Constitutional: Positive for fever, chills and malaise/fatigue. Negative for weight loss and diaphoresis.  HENT: Negative for congestion and hearing loss.   Eyes: Negative for blurred vision and double vision.  Respiratory: Negative for cough, hemoptysis, sputum production, shortness of breath and wheezing.   Cardiovascular: Negative for chest pain, palpitations and orthopnea.       Rt side chest wall/abd pain swelling   Gastrointestinal: Negative for heartburn, nausea, vomiting, abdominal pain, diarrhea, constipation and blood in stool.   Genitourinary: Negative for dysuria and urgency.  Musculoskeletal: Positive for myalgias and joint pain. Negative for neck pain.  Skin: Negative for rash.  Neurological: Negative for dizziness, tingling, tremors, weakness and headaches.  Endo/Heme/Allergies: Does not bruise/bleed easily.  Psychiatric/Behavioral: Negative for depression, suicidal ideas and substance abuse.  All other systems reviewed and are negative.    VS: BP 85/52 mmHg  Pulse 95  Temp(Src) 97.9 F (36.6 C) (Axillary)  Resp 13  Ht 5\' 3"  (1.6 m)  Wt 195 lb (88.451 kg)  BMI 34.55 kg/m2  SpO2 98%  LMP 09/03/2015     PHYSICAL EXAM  Physical Exam  Constitutional: She is oriented to person, place, and time. She appears well-developed and well-nourished. No distress.  HENT:  Head: Normocephalic and atraumatic.  Mouth/Throat: No oropharyngeal exudate.  Eyes: EOM are normal. Pupils are equal, round, and reactive to light. No scleral icterus.  Neck: Normal range of motion. Neck supple.  Cardiovascular: Normal rate, regular rhythm and normal heart sounds.   No murmur heard. Pulmonary/Chest: No stridor. No respiratory distress. She has no wheezes.  Abdominal: Soft. Bowel sounds are normal. She exhibits no distension. There is no tenderness. There is no rebound.  Musculoskeletal: Normal range of motion. She exhibits no edema.  Rt side chest wall erythematous/tenderness to touch  Neurological: She is alert and oriented to person, place, and time. She displays normal reflexes. Coordination normal.  Skin: Skin is warm. She is not diaphoretic.  Psychiatric: She has a normal mood and affect.        LABS    Recent Labs     09/09/15  0812  09/10/15  0550  HGB  11.7*  9.4*  HCT  34.4*  27.4*  MCV  82.9  83.2  WBC  19.8*  11.0  BUN  20  14  CREATININE  1.65*  0.75  GLUCOSE  185*  113*  CALCIUM  8.0*  6.5*  ,     CULTURE RESULTS   Recent Results (from the past 240 hour(s))  MRSA PCR Screening      Status: None   Collection Time: 09/09/15  2:11 PM  Result Value Ref Range Status   MRSA by PCR NEGATIVE NEGATIVE Final    Comment:        The GeneXpert MRSA Assay (FDA approved for NASAL specimens only), is one component of a comprehensive MRSA colonization surveillance program. It is not intended to diagnose MRSA infection nor to guide or monitor treatment for MRSA infections.           IMAGING    Dg Cervical Spine Complete  09/04/2015  CLINICAL DATA:  Left neck and shoulder pain for 2 months. No known injury EXAM: CERVICAL SPINE - COMPLETE 4+ VIEW COMPARISON:  None. FINDINGS: There is no evidence of cervical spine fracture or prevertebral soft tissue swelling. Alignment is normal. Mild anterior endplate osteophyte formation is seen at C5-6 without significant  disc space narrowing. No other significant bone abnormality identified. IMPRESSION: No acute findings. Mild vertebral endplate osteophyte formation at C5-6. Electronically Signed   By: Myles Rosenthal M.D.   On: 09/04/2015 09:07        ASSESSMENT/PLAN   45 yo white female with abd wall cellulitis with sepsis  1. LA improving with IVF's 2.IVF's as needed 3. Keep MAP>60 4.no need for central line at this moment-patient is asking if really needed 5.continue Iv abx as prescribed    I have personally obtained a history, examined the patient, evaluated laboratory and independently reviewed imaging results, formulated the assessment and plan and placed orders.  The Patient requires high complexity decision making for assessment and support, frequent evaluation and titration of therapies, application of advanced monitoring technologies and extensive interpretation of multiple databases.    Patient satisfied with Plan of action and management. All questions answered  Lucie Leather, M.D.  [facility]  Medical Director [facility] Medical Director [facility]

## 2015-09-10 NOTE — Consult Note (Signed)
Patient ID: Sandy Mitchell, female   DOB: Jan 21, 1971, 45 y.o.   MRN: [id number]  HPI Sandy Mitchell is a 45 y.o. female to see in consultation for worsening cellulitis of the abdominal wall and right chest wall. She reports that he started 4 days ago with a small spot and has progressed really rapidly over the last 48 hours with increasing erythema and abdominal pain.. Pain is moderate is intermittent and is worsening with movement. Patient denies any previous trauma or any previous operation to this area. And she does not have any immunodeficiencies she is not on his steroids no evidence of active cancer. Initially when she was in the emergency room she was found to be hypotensive, tachycardic and showing signs of septic shock. Since she has been in the hospital she was admitted to the ICU adequately resuscitated and her blood pressure and organ perfusion has improved significantly along with her acute kidney injury  HPI  Past Medical History  Diagnosis Date  . Fibromyalgia   . Migraine   . Thyroid disease   . Migraine   . Chronic neck pain     Past Surgical History  Procedure Laterality Date  . Tubal ligation      Family History  Problem Relation Age of Onset  . Healthy Mother   . Kidney failure Father   . Hypertension Father   . Diabetes Father     Social History Social History  Substance Use Topics  . Smoking status: Former Games developer  . Smokeless tobacco: None  . Alcohol Use: No    Allergies  Allergen Reactions  . Amlodipine Other (See Comments)    Reaction: extremities get swollen  . Melatonin Other (See Comments)    Reaction: Hallucinations   . Phenergan [Promethazine Hcl] Other (See Comments)    Reaction: Hallucinations  . Trazodone And Nefazodone Other (See Comments)    Reaction: Hallucination  . Atenolol Anxiety  . Nystatin Rash    Current Facility-Administered Medications  Medication Dose Route Frequency Provider Last Rate Last Dose  . 0.9 %  sodium  chloride infusion   Intravenous Continuous [name], MD 125 mL/hr at 09/10/15 0500    . acetaminophen (TYLENOL) tablet 650 mg  650 mg Oral Q6H PRN [name], MD   650 mg at 09/10/15 0030   Or  . acetaminophen (TYLENOL) suppository 650 mg  650 mg Rectal Q6H PRN [name], MD      . butalbital-acetaminophen-caffeine (FIORICET, ESGIC) [phone] MG per tablet 1 tablet  1 tablet Oral Q6H PRN [name], MD   1 tablet at 09/10/15 0245  . clonazePAM (KLONOPIN) tablet 0.5 mg  0.5 mg Oral BID PRN [name], MD      . diatrizoate meglumine-sodium (GASTROGRAFIN) 66-10 % solution 15 mL  15 mL Oral Q1 Hr x 2 Diego F Pabon, MD      . diclofenac (FLECTOR) 1.3 % 1 patch  1 patch Transdermal BID [name], MD   1 patch at 09/10/15 [phone]  . enoxaparin (LOVENOX) injection 40 mg  40 mg Subcutaneous Q24H [name], MD   40 mg at 09/09/15 1521  . fluticasone (FLONASE) 50 MCG/ACT nasal spray 1 spray  1 spray Each Nare Daily PRN [name], MD      . guaiFENesin-dextromethorphan (ROBITUSSIN DM) 100-10 MG/5ML syrup 5 mL  5 mL Oral Q4H PRN [name], MD      . levothyroxine (SYNTHROID, LEVOTHROID) tablet 88 mcg  88 mcg Oral QAC breakfast [name], MD  88 mcg at 09/10/15 0848  . magnesium sulfate IVPB 2 g 50 mL  2 g Intravenous Once Cy Blamer, RPH      . modafinil (PROVIGIL) tablet 200 mg  200 mg Oral Daily [name], MD   200 mg at 09/10/15 0904  . morphine 2 MG/ML injection 2 mg  2 mg Intravenous Q4H PRN [name], MD      . nortriptyline (PAMELOR) capsule 30 mg  30 mg Oral QHS Cy Blamer, RPH      . ondansetron [facility]) tablet 4 mg  4 mg Oral Q8H PRN [name], MD      . oxyCODONE (Oxy IR/ROXICODONE) immediate release tablet 5 mg  5 mg Oral Q4H PRN [name], MD   5 mg at 09/10/15 1137  . piperacillin-tazobactam (ZOSYN) IVPB 3.375 g  3.375 g Intravenous 3 times per day [name], MD   3.375 g at 09/10/15 0600  .  potassium chloride SA (K-DUR,KLOR-CON) CR tablet 40 mEq  40 mEq Oral Once Cy Blamer, RPH      . pregabalin (LYRICA) capsule 50 mg  50 mg Oral TID [name], MD   50 mg at 09/10/15 0904  . sodium chloride 0.9 % bolus 1,000 mL  1,000 mL Intravenous PRN Oralia Manis, MD   1,000 mL at 09/10/15 0520  . sodium chloride flush (NS) 0.9 % injection 3 mL  3 mL Intravenous Q12H [name], MD   3 mL at 09/10/15 0906  . vancomycin (VANCOCIN) IVPB 1000 mg/200 mL premix  1,000 mg Intravenous Q8H Cy Blamer, RPH         Review of Systems A 10 point review of systems was asked and was negative except for the information on the HPI  Physical Exam Blood pressure 93/64, pulse 102, temperature 98 F (36.7 C), temperature source Oral, resp. rate 21, height  (1.6 m), weight 88.451 kg (195 lb), last menstrual period 09/03/2015, SpO2 100 %. CONSTITUTIONAL: NAD EYES: Pupils are equal, round, and reactive to light, Sclera are non-icteric. EARS, NOSE, MOUTH AND THROAT: The oropharynx is clear. The oral mucosa is pink and moist. Hearing is intact to voice. LYMPH NODES:  Lymph nodes in the neck are normal. RESPIRATORY:  Lungs are clear. There is normal respiratory effort, with equal breath sounds bilaterally, and without pathologic use of accessory muscles. CARDIOVASCULAR: Heart is regular without murmurs, gallops, or rubs. GI: The abdomen is soft,  and nondistended. There are no palpable masses. There is no hepatosplenomegaly. There are normal bowel sounds in all quadrants. GU: Rectal deferred.   MUSCULOSKELETAL: Normal muscle strength and tone. No cyanosis or edema.   SKIN: There is an area of erythema along the right flank right upper quadrant and Colrain the inferior right-sided chest wall with some induration extended to palpation there is some strikes there is no evidence of subcutaneous air no definitive area of fluctuance and no evidence of subcutaneous emphysema   NEUROLOGIC:  Motor and sensation is grossly normal. Cranial nerves are grossly intact. PSYCH:  Oriented to person, place and time. Affect is normal.  Data Reviewed I have personally reviewed the patient's imaging, laboratory findings and medical records.    Assessment/Plan   abdominal wall cellulitis extending to the chest wall I am concerned about a possible abscess however on physical exam it is not obvious. Also concerning for a possible necrotizing infection given the rate at which the patient is experiencing her symptoms and erythema. And we'll start  with a CT scan of the abdomen and pelvis to evaluate for any necrotizing infection is there is any collections will definitely need surgical intervention with drainage and debridement. And she just had breakfast this morning we'll make sure that she is nothing by mouth and. She is hydrated him on aggressive and give her some albumin to improve her perfusion before her CT scan. No need for an immediate surgical intervention but may need an operation today if we encounter a significant finding on CT. Extensive counseling provided to the patient  Sterling Bigiego Pabon, MD FACS General Surgeon 09/10/2015, 11:55 AM

## 2015-09-10 NOTE — Consult Note (Signed)
MEDICATION RELATED CONSULT NOTE - INITIAL   Pharmacy Consult for electrolyte management Indication: hypokalemia, hypomagnesemia  Allergies  Allergen Reactions  . Amlodipine Other (See Comments)    Reaction: extremities get swollen  . Melatonin Other (See Comments)    Reaction: Hallucinations   . Phenergan [Promethazine Hcl] Other (See Comments)    Reaction: Hallucinations  . Trazodone And Nefazodone Other (See Comments)    Reaction: Hallucination  . Atenolol Anxiety  . Nystatin Rash    Patient Measurements: Height: 5\' 3"  (160 cm) Weight: 195 lb (88.451 kg) IBW/kg (Calculated) : 52.4  Vital Signs: Temp: 98 F (36.7 C) (04/07 1128) Temp Source: Oral (04/07 1128) BP: 93/64 mmHg (04/07 1100) Pulse Rate: 102 (04/07 1100) Intake/Output from previous day: 04/06 0701 - 04/07 0700 In: 11434.2 [P.O.:480; I.V.:2133.3; IV Piggyback:8820.8] Out: 850 [Urine:850] Intake/Output from this shift: Total I/O In: 930 [P.O.:330; I.V.:500; IV Piggyback:100] Out: 550 [Urine:550]  Labs:  Recent Labs  09/09/15 0812 09/10/15 0550  WBC 19.8* 11.0  HGB 11.7* 9.4*  HCT 34.4* 27.4*  PLT 261 179  CREATININE 1.65* 0.75  MG  --  1.6*   Estimated Creatinine Clearance: 93.6 mL/min (by C-G formula based on Cr of 0.75).   Microbiology: Recent Results (from the past 720 hour(s))  MRSA PCR Screening     Status: None   Collection Time: 09/09/15  2:11 PM  Result Value Ref Range Status   MRSA by PCR NEGATIVE NEGATIVE Final    Comment:        The GeneXpert MRSA Assay (FDA approved for NASAL specimens only), is one component of a comprehensive MRSA colonization surveillance program. It is not intended to diagnose MRSA infection nor to guide or monitor treatment for MRSA infections.     Medical History: Past Medical History  Diagnosis Date  . Fibromyalgia   . Migraine   . Thyroid disease   . Migraine   . Chronic neck pain     Medications:  Scheduled:  . diatrizoate  meglumine-sodium  15 mL Oral Q1 Hr x 2  . diclofenac  1 patch Transdermal BID  . enoxaparin (LOVENOX) injection  40 mg Subcutaneous Q24H  . levothyroxine  88 mcg Oral QAC breakfast  . magnesium sulfate 1 - 4 g bolus IVPB  2 g Intravenous Once  . modafinil  200 mg Oral Daily  . nortriptyline  30 mg Oral QHS  . piperacillin-tazobactam (ZOSYN)  IV  3.375 g Intravenous 3 times per day  . potassium chloride  40 mEq Oral Once  . pregabalin  50 mg Oral TID  . sodium chloride flush  3 mL Intravenous Q12H  . vancomycin  1,000 mg Intravenous Q8H    Assessment: KA is a 45yo female presenting with abdominal wall cellulitis. Pharmacy consulted to manage electrolytes in this patient.  Plan:  4/7 K 3.5, Mag 1.6.   Will give KCl 40mEq PO x1 and Mag 2g IV x1.   Will recheck with AM labs.  Cy BlamerAllison K Wealthy Danielski 09/10/2015,11:43 AM

## 2015-09-10 NOTE — Progress Notes (Signed)
Dr. Anne HahnWillis notified of patient's BP 78/60 and patient remains tachycardic.  Orders received for PRN NS bolus for SBP>90 and to redraw lactic acid.  Will continue to monitor.

## 2015-09-10 NOTE — Progress Notes (Signed)
Dr. Sheryle Hailiamond notified of patient's SBP remains in 70s, despite having had 2L boluses.  Orders received to give another 1L bolus.  Will continue to monitor.

## 2015-09-11 DIAGNOSIS — L03311 Cellulitis of abdominal wall: Secondary | ICD-10-CM

## 2015-09-11 LAB — BASIC METABOLIC PANEL
Anion gap: 4 — ABNORMAL LOW (ref 5–15)
CHLORIDE: 108 mmol/L (ref 101–111)
CO2: 18 mmol/L — ABNORMAL LOW (ref 22–32)
CREATININE: 0.74 mg/dL (ref 0.44–1.00)
Calcium: 7.1 mg/dL — ABNORMAL LOW (ref 8.9–10.3)
GFR calc Af Amer: >60 mL/min (ref >60)
GLUCOSE: 124 mg/dL — AB (ref 65–99)
POTASSIUM: 3.1 mmol/L — AB (ref 3.5–5.1)
Sodium: 130 mmol/L — ABNORMAL LOW (ref 135–145)

## 2015-09-11 LAB — MAGNESIUM: MAGNESIUM: 1.9 mg/dL (ref 1.7–2.4)

## 2015-09-11 LAB — VANCOMYCIN, TROUGH: Vancomycin Tr: 15 ug/mL (ref 10–20)

## 2015-09-11 MED ORDER — POTASSIUM CHLORIDE 20 MEQ PO PACK
40.0000 meq | PACK | Freq: Two times a day (BID) | ORAL | Status: AC
  Administered 2015-09-11 (×2): 40 meq via ORAL
  Filled 2015-09-11 (×2): qty 2

## 2015-09-11 NOTE — Consult Note (Signed)
MEDICATION RELATED CONSULT NOTE -follow up  Pharmacy Consult for electrolyte management Indication: hypokalemia, hypomagnesemia  Allergies  Allergen Reactions  . Amlodipine Other (See Comments)    Reaction: extremities get swollen  . Melatonin Other (See Comments)    Reaction: Hallucinations   . Phenergan [Promethazine Hcl] Other (See Comments)    Reaction: Hallucinations  . Trazodone And Nefazodone Other (See Comments)    Reaction: Hallucination  . Atenolol Anxiety  . Nystatin Rash    Patient Measurements: Height: 5\' 3"  (160 cm) Weight: 195 lb (88.451 kg) IBW/kg (Calculated) : 52.4  Vital Signs: Temp: 99.1 F (37.3 C) (04/08 0346) Temp Source: Oral (04/08 0346) BP: 111/72 mmHg (04/08 0600) Pulse Rate: 105 (04/08 0600) Intake/Output from previous day: 04/07 0701 - 04/08 0700 In: 3983 [P.O.:330; I.V.:2753; IV Piggyback:900] Out: 3525 [Urine:3525] Intake/Output from this shift:    Labs:  Recent Labs  09/09/15 0812 09/10/15 0550 09/11/15 0506  WBC 19.8* 11.0  --   HGB 11.7* 9.4*  --   HCT 34.4* 27.4*  --   PLT 261 179  --   CREATININE 1.65* 0.75 0.74  MG  --  1.6* 1.9   Lab Results  Component Value Date   K 3.1* 09/11/2015   Estimated Creatinine Clearance: 93.6 mL/min (by C-G formula based on Cr of 0.74).   Medical History: Past Medical History  Diagnosis Date  . Fibromyalgia   . Migraine   . Thyroid disease   . Migraine   . Chronic neck pain     Medications:  Scheduled:  . diclofenac  1 patch Transdermal BID  . enoxaparin (LOVENOX) injection  40 mg Subcutaneous Q24H  . levothyroxine  88 mcg Oral QAC breakfast  . modafinil  200 mg Oral Daily  . nortriptyline  30 mg Oral QHS  . piperacillin-tazobactam (ZOSYN)  IV  3.375 g Intravenous 3 times per day  . potassium chloride  40 mEq Oral BID  . pregabalin  50 mg Oral TID  . sodium chloride flush  3 mL Intravenous Q12H  . vancomycin  1,000 mg Intravenous Q8H    Assessment: KA is a 45yo female  presenting with abdominal wall cellulitis. Pharmacy consulted to manage electrolytes in this patient.  Plan:  K 3.1, Mag 1.9.   MD ordered KCL 40meq packet po x 2 doses.  Will recheck with AM labs.  Pansie Guggisberg A 09/11/2015,10:13 AM

## 2015-09-11 NOTE — Progress Notes (Signed)
Pharmacy Antibiotic Note  Sandy PaymentKelly Tocci is a 45 y.o. female admitted on 09/09/2015 with sepsis and cellulitis.  Pharmacy has been consulted for Zosyn and vancomycin dosing. Per notes pt on Bactrim PTA.  This is day #3 of antibiotics  Plan:  Vancomycin 1000 mg IV every 8 hours.  Goal trough 15-20 mcg/mL. Zosyn 3.375g IV q8h (4 hour infusion).   Vancomycin trough was therapeutic at 15 mcg/mL prior to the 5th dose on a regimen of vancomycin 1000 mg IV q8h. Will continue current dose and recheck level on 4/10 if renal function remains stable.  Vancomycin trough ordered for 4/10 at 1130  Height: 5\' 3"  (160 cm) Weight: 195 lb (88.451 kg) IBW/kg (Calculated) : 52.4  Temp (24hrs), Avg:99.2 F (37.3 C), Min:98.9 F (37.2 C), Max:99.4 F (37.4 C)   Recent Labs Lab 09/09/15 0812 09/09/15 1640 09/09/15 1910 09/10/15 0221 09/10/15 0550 09/11/15 0506 09/11/15 1920  WBC 19.8*  --   --   --  11.0  --   --   CREATININE 1.65*  --   --   --  0.75 0.74  --   LATICACIDVEN  --  2.7* 2.4* 1.9  --   --   --   VANCOTROUGH  --   --   --   --   --   --  15    Estimated Creatinine Clearance: 93.6 mL/min (by C-G formula based on Cr of 0.74).    Allergies  Allergen Reactions  . Amlodipine Other (See Comments)    Reaction: extremities get swollen  . Melatonin Other (See Comments)    Reaction: Hallucinations   . Phenergan [Promethazine Hcl] Other (See Comments)    Reaction: Hallucinations  . Trazodone And Nefazodone Other (See Comments)    Reaction: Hallucination  . Atenolol Anxiety  . Nystatin Rash    Antimicrobials this admission: Vanc 4/6 >>  Zosyn 4/6 >>   Dose adjustments this admission: Vancomycin 1 gram IV q24h adjusted to 1 gram Q8h on 4/7 for improved renal fxn.  4/8 VT 15 mcg/mL - continue with 1000 mg IV q8h  Microbiology results: None  Thank you for allowing pharmacy to be a part of this patient's care.  Cindi CarbonMary M Perlie Stene, PharmD Clinical Pharmacist 09/11/2015 8:55 PM

## 2015-09-11 NOTE — Progress Notes (Signed)
Cellulitis abd and chest wall CT reviewed no abscess or Necrosis She feels better VSS  PE:NAD, non toxic Skin: erythema, blanching w mild improvement, no abscess or necrotizing infection Abd: soft , minimal tenderness on abd wall, no peritonitis  A/P Cellulitis continue Aggressive A/B No surgical intervention We will be available if needed

## 2015-09-11 NOTE — Progress Notes (Signed)
Earnie LarssonM. Tukvo, NP notified of Na and K.  Acknowledge.  No new orders at this time.

## 2015-09-11 NOTE — Plan of Care (Signed)
Problem: Skin Integrity: Goal: Skin integrity will improve Outcome: Not Progressing Right side/flank area remain warm to the touch and with erythema.  Slight increase since 1900.  Remains on IV ABX.

## 2015-09-11 NOTE — Progress Notes (Addendum)
[facility] [place] [facility]   PATIENT NAME: Sandy Mitchell    MR#:  [id number]  DATE OF BIRTH:  03/08/1971  SUBJECTIVE:    Patient havs some pain at area of cellulitis. BP is stable.  REVIEW OF SYSTEMS:    Review of Systems  Constitutional: Negative for fever, chills and malaise/fatigue.  HENT: Negative for ear discharge, ear pain, hearing loss, nosebleeds and sore throat.   Eyes: Negative for blurred vision and pain.  Respiratory: Negative for cough, hemoptysis, shortness of breath and wheezing.   Cardiovascular: Negative for chest pain, palpitations and leg swelling.  Gastrointestinal: Negative for nausea, vomiting, abdominal pain, diarrhea and blood in stool.  Genitourinary: Negative for dysuria.  Musculoskeletal: Negative for back pain.  Skin:       Abdominal cellulitis  Neurological: Negative for dizziness, tremors, speech change, focal weakness, seizures and headaches.  Endo/Heme/Allergies: Does not bruise/bleed easily.  Psychiatric/Behavioral: Negative for depression, suicidal ideas and hallucinations.    Tolerating Diet:yes      DRUG ALLERGIES:   Allergies  Allergen Reactions  . Amlodipine Other (See Comments)    Reaction: extremities get swollen  . Melatonin Other (See Comments)    Reaction: Hallucinations   . Phenergan [Promethazine Hcl] Other (See Comments)    Reaction: Hallucinations  . Trazodone And Nefazodone Other (See Comments)    Reaction: Hallucination  . Atenolol Anxiety  . Nystatin Rash    VITALS:  Blood pressure 118/69, pulse 104, temperature 99.1 F (37.3 C), temperature source Oral, resp. rate 24, height 5\' 3"  (1.6 m), weight 88.451 kg (195 lb), last menstrual period 09/03/2015, SpO2 96 %.  PHYSICAL EXAMINATION:   Physical Exam  Constitutional: She is oriented to person, place, and time and well-developed, well-nourished, and in no distress. No distress.  HENT:  Head: Normocephalic.  Eyes: No scleral  icterus.  Neck: Normal range of motion. Neck supple. No JVD present. No tracheal deviation present.  Cardiovascular: Normal rate, regular rhythm and normal heart sounds.  Exam reveals no gallop and no friction rub.   No murmur heard. Pulmonary/Chest: Effort normal and breath sounds normal. No respiratory distress. She has no wheezes. She has no rales. She exhibits no tenderness.  Abdominal: Soft. Bowel sounds are normal. She exhibits no distension and no mass. There is no tenderness. There is no rebound and no guarding.  Musculoskeletal: Normal range of motion. She exhibits no edema.  Neurological: She is alert and oriented to person, place, and time.  Skin: Skin is warm. No rash noted. No erythema.  Abdominal wall cellulitis on the right side with erythema and warmness with tenderness to palpation. Psychiatric: Affect and judgment normal.      LABORATORY PANEL:   CBC  Recent Labs Lab 09/10/15 0550  WBC 11.0  HGB 9.4*  HCT 27.4*  PLT 179   ------------------------------------------------------------------------------------------------------------------  Chemistries   Recent Labs Lab 09/11/15 0506  NA 130*  K 3.1*  CL 108  CO2 18*  GLUCOSE 124*  BUN <5*  CREATININE 0.74  CALCIUM 7.1*  MG 1.9   ------------------------------------------------------------------------------------------------------------------  Cardiac Enzymes No results for input(s): TROPONINI in the last 168 hours. ------------------------------------------------------------------------------------------------------------------  RADIOLOGY:  Ct Abdomen Pelvis W Contrast  09/10/2015  CLINICAL DATA:  Right-sided abdominal pain EXAM: CT ABDOMEN AND PELVIS WITH CONTRAST TECHNIQUE: Multidetector CT imaging of the abdomen and pelvis was performed using the standard protocol following bolus administration of intravenous contrast. CONTRAST:  100mL ISOVUE-300 IOPAMIDOL (ISOVUE-300) INJECTION 61% COMPARISON:   None.  FINDINGS: Lung bases demonstrate small bilateral pleural effusions and bibasilar infiltrate. The liver, gallbladder, spleen, adrenal glands and pancreas are within normal limits. The kidneys are well visualized and reveal a normal enhancement pattern. No abnormal mass or abnormal calcifications are seen. No obstructive changes are noted. The appendix is well visualized and within normal limits. No significant diverticular change of the colon is noted. The uterus and ovaries are unremarkable. Minimal free fluid is noted within the pelvis this may be physiologic in nature. The bladder is well distended. No bony abnormality is seen. Significant subcutaneous edema and skin thickening is noted in the abdominal wall particularly on the right. No focal abscess is identified. IMPRESSION: Changes consistent with cellulitis and edema in the abdominal wall particularly on the right. No focal abscess is seen. No other acute abnormality within the abdomen and pelvis is seen. Electronically Signed   By: Alcide Clever M.D.   On: 09/10/2015 18:58     ASSESSMENT AND PLAN:    45 y/o female with fibromyalgia here with sepsis and abdominal wall cellulitis.  1. Abdominal wall cellulitis extending to chest wall:  No abscess per CT scan of abdomen. Continue ZOSYN and VANCOMYCIN. No surgery at this time per surgeon.  2. SEVERE Sepsis: This is from abdominal wall cellulitis. She presented with hypotension, tachycardia. Continue abx as above.   Hypotension improved. Continue IV fluids.  Lactic acidosis. Improved.  3. Acute renal failure: This is due to hypovolemia. improved with IV fluids.  4. Hyponatremia: This is due to hypovolemia/dehydration.  Continue IV fluids.  5. Hypothyroidism: Continue Synthroid.  6. Hypomagnesemia: Repleted and improved.  * Hypokalemia. KCl. F/u BMP.  7. Fibromyalgia: Zanaflex has been discontinued as this can cause hypotension. Continue all other medications.  Management  plans discussed with the patient and she is in agreement.  CODE STATUS: FULL  CARETOTAL TIME TAKING CARE OF THIS PATIENT: 38 minutes.    POSSIBLE D/C 3 days, DEPENDING ON CLINICAL CONDITION.   Shaune Pollack M.D on 09/11/2015 at 12:56 PM  Between 7am to 6pm - Pager - [phone] After 6pm go to [email or web address] - password [email or web address]  [facility]  Office  [phone]  CC: Primary care physician; Default, Provider, MD  Note: This dictation was prepared with Dragon dictation along with smaller phrase technology. Any transcriptional errors that result from this process are unintentional.

## 2015-09-11 NOTE — Progress Notes (Signed)
Pharmacy Antibiotic Note  Sandy PaymentKelly Mitchell is a 45 y.o. female admitted on 09/09/2015 with sepsis and cellulitis.  Pharmacy has been consulted for Zosyn and vancomycin dosing. Per notes pt on Bactrim PTA.  Plan:  Vancomycin 1000 mg IV every 8 hours.  Goal trough 15-20 mcg/mL. Zosyn 3.375g IV q8h (4 hour infusion).   Adjusted patient's dose 4/7 as her renal function has improved significantly with fluids (Scr 1.65-->0.75). Ke 0.087, half life 7.97 h, Vd 61.9 L Cpk= 28.45, Ctr= 15.47  Will check trough prior to 5th dose @1930  on 4/8.  Height: 5\' 3"  (160 cm) Weight: 195 lb (88.451 kg) IBW/kg (Calculated) : 52.4  Temp (24hrs), Avg:98.9 F (37.2 C), Min:98 F (36.7 C), Max:99.5 F (37.5 C)   Recent Labs Lab 09/09/15 0812 09/09/15 1640 09/09/15 1910 09/10/15 0221 09/10/15 0550 09/11/15 0506  WBC 19.8*  --   --   --  11.0  --   CREATININE 1.65*  --   --   --  0.75 0.74  LATICACIDVEN  --  2.7* 2.4* 1.9  --   --     Estimated Creatinine Clearance: 93.6 mL/min (by C-G formula based on Cr of 0.74).    Allergies  Allergen Reactions  . Amlodipine Other (See Comments)    Reaction: extremities get swollen  . Melatonin Other (See Comments)    Reaction: Hallucinations   . Phenergan [Promethazine Hcl] Other (See Comments)    Reaction: Hallucinations  . Trazodone And Nefazodone Other (See Comments)    Reaction: Hallucination  . Atenolol Anxiety  . Nystatin Rash    Antimicrobials this admission: Vanc 4/6 >>  Zosyn 4/6 >>   Dose adjustments this admission: Vancomycin 1 gram IV q24h adjusted to 1 gram Q8h on 4/7 for improved renal fxn.  Microbiology results: None  Thank you for allowing pharmacy to be a part of this patient's care.  Nelia Rogoff A 09/11/2015 10:19 AM

## 2015-09-11 NOTE — Progress Notes (Signed)
Patient transferred form CCU. Refused TEDS and SCD's. Sandy RoVerne H. Caidynce Muzyka, RN 09/11/15 [phone]

## 2015-09-11 NOTE — Consult Note (Signed)
[facility] [place] [facility] Consultation      Date: 09/11/2015,   MRN# [id number] Sandy Mitchell [birth year] Code Status:     Code Status Orders        Start     Ordered   09/09/15 1056  Full code   Continuous     09/09/15 1055    Code Status History    Date Active Date Inactive Code Status Order ID Comments User Context   This patient has a current code status but no historical code status.     Hosp day:@LENGTHOFSTAYDAYS @ Referring MD: @     PCP:      AdmissionWeight: 195 lb (88.451 kg)                 CurrentWeight: 195 lb (88.451 kg) Sandy Mitchell is a 45 y.o. old female seen in consultation for infection at the request of dr. Hilton Sinclair     CHIEF COMPLAINT:   Chest wall pain   HISTORY OF PRESENT ILLNESS  Resting comfortably today NAD LA 2.7>>>>1.9 given approx 4  liters NS   MEDICATIONS    Home Medication:  No current outpatient prescriptions on file.  Current Medication:  Current facility-administered medications:  .  0.9 %  sodium chloride infusion, , Intravenous, Continuous, [name], MD, Last Rate: 125 mL/hr at 09/10/15 1217 .  acetaminophen (TYLENOL) tablet 650 mg, 650 mg, Oral, Q6H PRN, 650 mg at 09/10/15 0030 **OR** acetaminophen (TYLENOL) suppository 650 mg, 650 mg, Rectal, Q6H PRN, [name], MD .  butalbital-acetaminophen-caffeine (FIORICET, ESGIC) [phone] MG per tablet 1 tablet, 1 tablet, Oral, Q6H PRN, [name], MD, 1 tablet at 09/10/15 0245 .  clonazePAM (KLONOPIN) tablet 0.5 mg, 0.5 mg, Oral, BID PRN, [name], MD .  diclofenac (FLECTOR) 1.3 % 1 patch, 1 patch, Transdermal, BID, [name], MD, 1 patch at 09/10/15 2200 .  enoxaparin (LOVENOX) injection 40 mg, 40 mg, Subcutaneous, Q24H, [name], MD, 40 mg at 09/09/15 1521 .  fluticasone (FLONASE) 50 MCG/ACT nasal spray 1 spray, 1 spray, Each Nare, Daily PRN, [name], MD .  guaiFENesin-dextromethorphan (ROBITUSSIN DM) 100-10 MG/5ML  syrup 5 mL, 5 mL, Oral, Q4H PRN, [name], MD, 5 mL at 09/10/15 1844 .  levothyroxine (SYNTHROID, LEVOTHROID) tablet 88 mcg, 88 mcg, Oral, QAC breakfast, [name], MD, 88 mcg at 09/10/15 0848 .  modafinil (PROVIGIL) tablet 200 mg, 200 mg, Oral, Daily, [name], MD, 200 mg at 09/10/15 0904 .  morphine 2 MG/ML injection 2 mg, 2 mg, Intravenous, Q4H PRN, [name], MD .  nortriptyline (PAMELOR) capsule 30 mg, 30 mg, Oral, QHS, Cy Blamer, RPH, 30 mg at 09/10/15 2147 .  ondansetron (ZOFRAN) tablet 4 mg, 4 mg, Oral, Q8H PRN, [name], MD .  oxyCODONE (Oxy IR/ROXICODONE) immediate release tablet 5 mg, 5 mg, Oral, Q4H PRN, [name], MD, 5 mg at 09/11/15 0522 .  piperacillin-tazobactam (ZOSYN) IVPB 3.375 g, 3.375 g, Intravenous, 3 times per day, [name], MD, 3.375 g at 09/11/15 0517 .  potassium chloride (KLOR-CON) packet 40 mEq, 40 mEq, Oral, BID, Lewie Loron, NP .  pregabalin (LYRICA) capsule 50 mg, 50 mg, Oral, TID, [name], MD, 50 mg at 09/10/15 2147 .  sodium chloride 0.9 % bolus 1,000 mL, 1,000 mL, Intravenous, PRN, Oralia Manis, MD, 1,000 mL at 09/10/15 0520 .  sodium chloride flush (NS) 0.9 % injection 3 mL, 3 mL, Intravenous, Q12H, [name], MD, 3 mL at 09/10/15 2200 .  vancomycin (VANCOCIN)  IVPB 1000 mg/200 mL premix, 1,000 mg, Intravenous, Q8H, Cy Blamer, RPH, 1,000 mg at 09/11/15 0345    ALLERGIES   Amlodipine; Melatonin; Phenergan; Trazodone and nefazodone; Atenolol; and Nystatin     REVIEW OF SYSTEMS   Review of Systems  Constitutional: Positive for malaise/fatigue. Negative for fever, chills, weight loss and diaphoresis.  HENT: Negative for congestion and hearing loss.   Eyes: Negative for blurred vision and double vision.  Respiratory: Negative for cough, hemoptysis, sputum production, shortness of breath and wheezing.   Cardiovascular: Negative for chest pain, palpitations and orthopnea.        Rt side chest wall/abd pain swelling   Gastrointestinal: Negative for heartburn, nausea, vomiting, abdominal pain, diarrhea, constipation and blood in stool.  Genitourinary: Negative for dysuria and urgency.  Musculoskeletal: Positive for myalgias and joint pain. Negative for neck pain.  Skin: Negative for rash.  Neurological: Negative for dizziness, tingling, tremors, weakness and headaches.  Endo/Heme/Allergies: Does not bruise/bleed easily.  Psychiatric/Behavioral: Negative for depression, suicidal ideas and substance abuse.  All other systems reviewed and are negative.    VS: BP 111/72 mmHg  Pulse 105  Temp(Src) 99.1 F (37.3 C) (Oral)  Resp 19  Ht  (1.6 m)  Wt 195 lb (88.451 kg)  BMI 34.55 kg/m2  SpO2 96%  LMP 09/03/2015     PHYSICAL EXAM  Physical Exam  Constitutional: She is oriented to person, place, and time. She appears well-developed and well-nourished. No distress.  HENT:  Head: Normocephalic and atraumatic.  Mouth/Throat: No oropharyngeal exudate.  Eyes: EOM are normal. Pupils are equal, round, and reactive to light. No scleral icterus.  Neck: Normal range of motion. Neck supple.  Cardiovascular: Normal rate, regular rhythm and normal heart sounds.   No murmur heard. Pulmonary/Chest: No stridor. No respiratory distress. She has no wheezes.  Abdominal: Soft. Bowel sounds are normal. She exhibits no distension. There is no tenderness. There is no rebound.  Musculoskeletal: Normal range of motion. She exhibits no edema.  Rt side chest wall erythematous/tenderness to touch, swelling reduced   Neurological: She is alert and oriented to person, place, and time. She displays normal reflexes. Coordination normal.  Skin: Skin is warm. She is not diaphoretic.  Psychiatric: She has a normal mood and affect.        LABS    Recent Labs     09/09/15  0812  09/10/15  0550  09/11/15  0506  HGB  11.7*  9.4*   --   HCT  34.4*  27.4*   --   MCV  82.9  83.2    --   WBC  19.8*  11.0   --   BUN  20  14  <5*  CREATININE  1.65*  0.75  0.74  GLUCOSE  185*  113*  124*  CALCIUM  8.0*  6.5*  7.1*  ,     CULTURE RESULTS   Recent Results (from the past 240 hour(s))  MRSA PCR Screening     Status: None   Collection Time: 09/09/15  2:11 PM  Result Value Ref Range Status   MRSA by PCR NEGATIVE NEGATIVE Final    Comment:        The GeneXpert MRSA Assay (FDA approved for NASAL specimens only), is one component of a comprehensive MRSA colonization surveillance program. It is not intended to diagnose MRSA infection nor to guide or monitor treatment for MRSA infections.           IMAGING  Dg Cervical Spine Complete  09/04/2015  CLINICAL DATA:  Left neck and shoulder pain for 2 months. No known injury EXAM: CERVICAL SPINE - COMPLETE 4+ VIEW COMPARISON:  None. FINDINGS: There is no evidence of cervical spine fracture or prevertebral soft tissue swelling. Alignment is normal. Mild anterior endplate osteophyte formation is seen at C5-6 without significant disc space narrowing. No other significant bone abnormality identified. IMPRESSION: No acute findings. Mild vertebral endplate osteophyte formation at C5-6. Electronically Signed   By: Myles RosenthalJohn  Stahl M.D.   On: 09/04/2015 09:07   Ct Abdomen Pelvis W Contrast  09/10/2015  CLINICAL DATA:  Right-sided abdominal pain EXAM: CT ABDOMEN AND PELVIS WITH CONTRAST TECHNIQUE: Multidetector CT imaging of the abdomen and pelvis was performed using the standard protocol following bolus administration of intravenous contrast. CONTRAST:  100mL ISOVUE-300 IOPAMIDOL (ISOVUE-300) INJECTION 61% COMPARISON:  None. FINDINGS: Lung bases demonstrate small bilateral pleural effusions and bibasilar infiltrate. The liver, gallbladder, spleen, adrenal glands and pancreas are within normal limits. The kidneys are well visualized and reveal a normal enhancement pattern. No abnormal mass or abnormal calcifications are seen. No  obstructive changes are noted. The appendix is well visualized and within normal limits. No significant diverticular change of the colon is noted. The uterus and ovaries are unremarkable. Minimal free fluid is noted within the pelvis this may be physiologic in nature. The bladder is well distended. No bony abnormality is seen. Significant subcutaneous edema and skin thickening is noted in the abdominal wall particularly on the right. No focal abscess is identified. IMPRESSION: Changes consistent with cellulitis and edema in the abdominal wall particularly on the right. No focal abscess is seen. No other acute abnormality within the abdomen and pelvis is seen. Electronically Signed   By: Alcide CleverMark  Lukens M.D.   On: 09/10/2015 18:58        ASSESSMENT/PLAN   45 yo white female with abd wall cellulitis with sepsis  1. LA improving with IVF's 2.IVF's as needed 3. Keep MAP>60 4.no need for central line at this moment-patient is asking if really needed 5.continue Iv abx as prescribed 6. CT A/P with no abscess for abd wall, can resume diet since no surgical intervention at this time.  7.stable to transfer out the ICU.     Thank you for consulting [facility], we will signoff at this time.  Please feel free to contact us with any questions at [phone] (please enter [phone]).   I have personally obtained a history, examined the patient, evaluated laboratory and independently reviewed imaging results, formulated the assessment and plan and placed orders.  The Patient requires high complexity decision making for assessment and support, frequent evaluation and titration of therapies, application of advanced monitoring technologies and extensive interpretation of multiple databases.    Pulmonary consult time - 30mins

## 2015-09-12 LAB — BASIC METABOLIC PANEL
Anion gap: 3 — ABNORMAL LOW (ref 5–15)
CALCIUM: 7.2 mg/dL — AB (ref 8.9–10.3)
CO2: 22 mmol/L (ref 22–32)
CREATININE: 0.65 mg/dL (ref 0.44–1.00)
Chloride: 108 mmol/L (ref 101–111)
GFR calc Af Amer: >60 mL/min (ref >60)
GLUCOSE: 124 mg/dL — AB (ref 65–99)
Potassium: 3.3 mmol/L — ABNORMAL LOW (ref 3.5–5.1)
SODIUM: 133 mmol/L — AB (ref 135–145)

## 2015-09-12 LAB — HEMOGLOBIN A1C: Hgb A1c MFr Bld: 5.6 % (ref 4.0–6.0)

## 2015-09-12 LAB — MAGNESIUM: Magnesium: 1.5 mg/dL — ABNORMAL LOW (ref 1.7–2.4)

## 2015-09-12 MED ORDER — POTASSIUM CHLORIDE CRYS ER 20 MEQ PO TBCR
20.0000 meq | EXTENDED_RELEASE_TABLET | ORAL | Status: AC
  Administered 2015-09-12 (×2): 20 meq via ORAL
  Filled 2015-09-12 (×2): qty 1

## 2015-09-12 MED ORDER — MAGNESIUM SULFATE 4 GM/100ML IV SOLN
4.0000 g | Freq: Once | INTRAVENOUS | Status: AC
  Administered 2015-09-12: 4 g via INTRAVENOUS
  Filled 2015-09-12: qty 100

## 2015-09-12 NOTE — Consult Note (Signed)
MEDICATION RELATED CONSULT NOTE -follow up  Pharmacy Consult for electrolyte management Indication: hypokalemia, hypomagnesemia  Allergies  Allergen Reactions  . Amlodipine Other (See Comments)    Reaction: extremities get swollen  . Melatonin Other (See Comments)    Reaction: Hallucinations   . Phenergan [Promethazine Hcl] Other (See Comments)    Reaction: Hallucinations  . Trazodone And Nefazodone Other (See Comments)    Reaction: Hallucination  . Atenolol Anxiety  . Nystatin Rash    Patient Measurements: Height: 5\' 3"  (160 cm) Weight: 195 lb (88.451 kg) IBW/kg (Calculated) : 52.4  Vital Signs: Temp: 98.6 F (37 C) (04/09 0601) Temp Source: Oral (04/09 0601) BP: 107/65 mmHg (04/09 0601) Pulse Rate: 98 (04/09 0601) Intake/Output from previous day: 04/08 0701 - 04/09 0700 In: 3739.2 [P.O.:840; I.V.:2849.2; IV Piggyback:50] Out: 1800 [Urine:1800] Intake/Output from this shift: Total I/O In: 240 [P.O.:240] Out: -   Labs:  Recent Labs  09/10/15 0550 09/11/15 0506 09/12/15 0518  WBC 11.0  --   --   HGB 9.4*  --   --   HCT 27.4*  --   --   PLT 179  --   --   CREATININE 0.75 0.74 0.65  MG 1.6* 1.9 1.5*   Lab Results  Component Value Date   K 3.3* 09/12/2015   Estimated Creatinine Clearance: 93.6 mL/min (by C-G formula based on Cr of 0.65).   Medical History: Past Medical History  Diagnosis Date  . Fibromyalgia   . Migraine   . Thyroid disease   . Migraine   . Chronic neck pain     Medications:  Scheduled:  . diclofenac  1 patch Transdermal BID  . enoxaparin (LOVENOX) injection  40 mg Subcutaneous Q24H  . levothyroxine  88 mcg Oral QAC breakfast  . modafinil  200 mg Oral Daily  . nortriptyline  30 mg Oral QHS  . piperacillin-tazobactam (ZOSYN)  IV  3.375 g Intravenous 3 times per day  . pregabalin  50 mg Oral TID  . sodium chloride flush  3 mL Intravenous Q12H  . vancomycin  1,000 mg Intravenous Q8H    Assessment: KA is a 45yo female  presenting with abdominal wall cellulitis. Pharmacy consulted to manage electrolytes in this patient.  Plan:  K 3.3, Mag 1.5.   Will order Magnesium 4 gram IV x 1.  Will order KCL 20meq PO q2h x 2 doses.  Will recheck with AM labs.  Jacci Ruberg A 09/12/2015,11:05 AM

## 2015-09-12 NOTE — Progress Notes (Addendum)
[facility] [place] [facility]   PATIENT NAME: Sandy Mitchell    MR#:  [id number]  DATE OF BIRTH:  09/05/1970  SUBJECTIVE:    Patient feels better but still has some pain at area of cellulitis. The infected area is smaller than before but still large.  REVIEW OF SYSTEMS:    Review of Systems  Constitutional: Negative for fever, chills and malaise/fatigue.  HENT: Negative for ear discharge, ear pain, hearing loss, nosebleeds and sore throat.   Eyes: Negative for blurred vision and pain.  Respiratory: Negative for cough, hemoptysis, shortness of breath and wheezing.   Cardiovascular: Negative for chest pain, palpitations and leg swelling.  Gastrointestinal: Negative for nausea, vomiting, abdominal pain, diarrhea and blood in stool.  Genitourinary: Negative for dysuria.  Musculoskeletal: Negative for back pain.  Skin:       Abdominal cellulitis  Neurological: Negative for dizziness, tremors, speech change, focal weakness, seizures and headaches.  Endo/Heme/Allergies: Does not bruise/bleed easily.  Psychiatric/Behavioral: Negative for depression, suicidal ideas and hallucinations.    Tolerating Diet:yes      DRUG ALLERGIES:   Allergies  Allergen Reactions  . Amlodipine Other (See Comments)    Reaction: extremities get swollen  . Melatonin Other (See Comments)    Reaction: Hallucinations   . Phenergan [Promethazine Hcl] Other (See Comments)    Reaction: Hallucinations  . Trazodone And Nefazodone Other (See Comments)    Reaction: Hallucination  . Atenolol Anxiety  . Nystatin Rash    VITALS:  Blood pressure 110/68, pulse 99, temperature 98.9 F (37.2 C), temperature source Oral, resp. rate 19, height 5\' 3"  (1.6 m), weight 88.451 kg (195 lb), last menstrual period 09/03/2015, SpO2 93 %.  PHYSICAL EXAMINATION:   Physical Exam  Constitutional: She is oriented to person, place, and time and well-developed, well-nourished, and in no  distress. No distress.  HENT:  Head: Normocephalic.  Eyes: No scleral icterus.  Neck: Normal range of motion. Neck supple. No JVD present. No tracheal deviation present.  Cardiovascular: Normal rate, regular rhythm and normal heart sounds.  Exam reveals no gallop and no friction rub.   No murmur heard. Pulmonary/Chest: Effort normal and breath sounds normal. No respiratory distress. She has no wheezes. She has no rales. She exhibits no tenderness.  Abdominal: Soft. Bowel sounds are normal. She exhibits no distension and no mass. There is no tenderness. There is no rebound and no guarding.  Musculoskeletal: Normal range of motion. Trace edema on bilateral upper and lower extremities.  Neurological: She is alert and oriented to person, place, and time.  Skin: Skin is warm. No rash noted. No erythema.  Large Abdominal wall cellulitis on the right side with erythema and warmness with tenderness to palpation. Psychiatric: Affect and judgment normal.      LABORATORY PANEL:   CBC  Recent Labs Lab 09/10/15 0550  WBC 11.0  HGB 9.4*  HCT 27.4*  PLT 179   ------------------------------------------------------------------------------------------------------------------  Chemistries   Recent Labs Lab 09/12/15 0518  NA 133*  K 3.3*  CL 108  CO2 22  GLUCOSE 124*  BUN <5*  CREATININE 0.65  CALCIUM 7.2*  MG 1.5*   ------------------------------------------------------------------------------------------------------------------  Cardiac Enzymes No results for input(s): TROPONINI in the last 168 hours. ------------------------------------------------------------------------------------------------------------------  RADIOLOGY:  Ct Abdomen Pelvis W Contrast  09/10/2015  CLINICAL DATA:  Right-sided abdominal pain EXAM: CT ABDOMEN AND PELVIS WITH CONTRAST TECHNIQUE: Multidetector CT imaging of the abdomen and pelvis was performed using the standard protocol following bolus  administration of intravenous contrast. CONTRAST:  ISOVUE-300 IOPAMIDOL (ISOVUE-300) INJECTION 61% COMPARISON:  None. FINDINGS: Lung bases demonstrate small bilateral pleural effusions and bibasilar infiltrate. The liver, gallbladder, spleen, adrenal glands and pancreas are within normal limits. The kidneys are well visualized and reveal a normal enhancement pattern. No abnormal mass or abnormal calcifications are seen. No obstructive changes are noted. The appendix is well visualized and within normal limits. No significant diverticular change of the colon is noted. The uterus and ovaries are unremarkable. Minimal free fluid is noted within the pelvis this may be physiologic in nature. The bladder is well distended. No bony abnormality is seen. Significant subcutaneous edema and skin thickening is noted in the abdominal wall particularly on the right. No focal abscess is identified. IMPRESSION: Changes consistent with cellulitis and edema in the abdominal wall particularly on the right. No focal abscess is seen. No other acute abnormality within the abdomen and pelvis is seen. Electronically Signed   By: Alcide Clever M.D.   On: 09/10/2015 18:58     ASSESSMENT AND PLAN:    45 y/o female with fibromyalgia here with sepsis and abdominal wall cellulitis.  1. Abdominal wall cellulitis extending to chest wall:  No abscess per CT scan of abdomen. Continue ZOSYN and VANCOMYCIN. No surgery at this time per surgeon.  2. SEVERE Sepsis: This is from abdominal wall cellulitis. She presented with hypotension, tachycardia. Improved.  Continue abx as above.   Hypotension improved. discontinue IV fluids.  Lactic acidosis. Improved.  3. Acute renal failure: This is due to hypovolemia. improved with IV fluids.  4. Hyponatremia: This is due to hypovolemia/dehydration.  Improving, discontinue IV fluids.  5. Hypothyroidism: Continue Synthroid.  6. Hypomagnesemia: Repleted and improved.  * Hypokalemia.  KCl. F/u BMP. *Hypomagnesemia. Give IV magnesium and a follow-up level.  7. Fibromyalgia: Zanaflex has been discontinued as this can cause hypotension. Continue all other medications.  Management plans discussed with the patient and she is in agreement.  CODE STATUS: FULL  CARETOTAL TIME TAKING CARE OF THIS PATIENT: 37 minutes.    POSSIBLE D/C 2-3 days, DEPENDING ON CLINICAL CONDITION.   Shaune Pollack M.D on 09/12/2015 at 2:00 PM  Between 7am to 6pm - Pager - [phone] After 6pm go to [email or web address] - password [email or web address]  [facility]  Office  [phone]  CC: Primary care physician; Default, Provider, MD  Note: This dictation was prepared with Dragon dictation along with smaller phrase technology. Any transcriptional errors that result from this process are unintentional.

## 2015-09-13 LAB — CBC
HEMATOCRIT: 29.3 % — AB (ref 35.0–47.0)
Hemoglobin: 10.1 g/dL — ABNORMAL LOW (ref 12.0–16.0)
MCH: 28 pg (ref 26.0–34.0)
MCHC: 34.6 g/dL (ref 32.0–36.0)
MCV: 80.9 fL (ref 80.0–100.0)
PLATELETS: 298 10*3/uL (ref 150–440)
RBC: 3.62 MIL/uL — AB (ref 3.80–5.20)
RDW: 14.4 % (ref 11.5–14.5)
WBC: 11.8 10*3/uL — AB (ref 3.6–11.0)

## 2015-09-13 LAB — BASIC METABOLIC PANEL
Anion gap: 6 (ref 5–15)
CHLORIDE: 105 mmol/L (ref 101–111)
CO2: 24 mmol/L (ref 22–32)
CREATININE: 0.64 mg/dL (ref 0.44–1.00)
Calcium: 7.9 mg/dL — ABNORMAL LOW (ref 8.9–10.3)
GFR calc Af Amer: >60 mL/min (ref >60)
GFR calc non Af Amer: >60 mL/min (ref >60)
Glucose, Bld: 126 mg/dL — ABNORMAL HIGH (ref 65–99)
POTASSIUM: 3.1 mmol/L — AB (ref 3.5–5.1)
SODIUM: 135 mmol/L (ref 135–145)

## 2015-09-13 LAB — MAGNESIUM: MAGNESIUM: 1.6 mg/dL — AB (ref 1.7–2.4)

## 2015-09-13 MED ORDER — POTASSIUM CHLORIDE CRYS ER 20 MEQ PO TBCR
40.0000 meq | EXTENDED_RELEASE_TABLET | Freq: Once | ORAL | Status: AC
  Administered 2015-09-13: 40 meq via ORAL
  Filled 2015-09-13: qty 2

## 2015-09-13 MED ORDER — SODIUM CHLORIDE 0.9 % IV SOLN
3.0000 g | Freq: Four times a day (QID) | INTRAVENOUS | Status: DC
  Administered 2015-09-13 – 2015-09-17 (×15): 3 g via INTRAVENOUS
  Filled 2015-09-13 (×19): qty 3

## 2015-09-13 MED ORDER — MAGNESIUM SULFATE 2 GM/50ML IV SOLN: 2.0000 g | Freq: Once | INTRAVENOUS | Status: DC

## 2015-09-13 MED ORDER — MAGNESIUM SULFATE IN D5W 10-5 MG/ML-% IV SOLN
1.0000 g | Freq: Once | INTRAVENOUS | Status: AC
  Administered 2015-09-13: 1 g via INTRAVENOUS
  Filled 2015-09-13: qty 100

## 2015-09-13 NOTE — Consult Note (Signed)
MEDICATION RELATED CONSULT NOTE -follow up  Pharmacy Consult for electrolyte management Indication: hypokalemia, hypomagnesemia  Allergies  Allergen Reactions  . Amlodipine Other (See Comments)    Reaction: extremities get swollen  . Melatonin Other (See Comments)    Reaction: Hallucinations   . Phenergan [Promethazine Hcl] Other (See Comments)    Reaction: Hallucinations  . Trazodone And Nefazodone Other (See Comments)    Reaction: Hallucination  . Atenolol Anxiety  . Nystatin Rash    Patient Measurements: Height: 5\' 3"  (160 cm) Weight: 195 lb (88.451 kg) IBW/kg (Calculated) : 52.4  Vital Signs: Temp: 98.2 F (36.8 C) (04/10 0504) Temp Source: Oral (04/10 0504) BP: 125/67 mmHg (04/10 0504) Pulse Rate: 98 (04/10 0504) Intake/Output from previous day: 04/09 0701 - 04/10 0700 In: 580 [P.O.:580] Out: 5001 [Urine:5000; Stool:1] Intake/Output from this shift:    Labs:  Recent Labs  09/11/15 0506 09/12/15 0518 09/13/15 0645  WBC  --   --  11.8*  HGB  --   --  10.1*  HCT  --   --  29.3*  PLT  --   --  298  CREATININE 0.74 0.65 0.64  MG 1.9 1.5* 1.6*   Lab Results  Component Value Date   K 3.1* 09/13/2015   Estimated Creatinine Clearance: 93.6 mL/min (by C-G formula based on Cr of 0.64).   Medical History: Past Medical History  Diagnosis Date  . Fibromyalgia   . Migraine   . Thyroid disease   . Migraine   . Chronic neck pain     Medications:  Scheduled:  . diclofenac  1 patch Transdermal BID  . enoxaparin (LOVENOX) injection  40 mg Subcutaneous Q24H  . levothyroxine  88 mcg Oral QAC breakfast  . magnesium sulfate 1 - 4 g bolus IVPB  1 g Intravenous Once  . modafinil  200 mg Oral Daily  . nortriptyline  30 mg Oral QHS  . piperacillin-tazobactam (ZOSYN)  IV  3.375 g Intravenous 3 times per day  . potassium chloride  40 mEq Oral Once  . pregabalin  50 mg Oral TID  . sodium chloride flush  3 mL Intravenous Q12H  . vancomycin  1,000 mg Intravenous  Q8H    Assessment: KA is a 45yo female presenting with abdominal wall cellulitis. Pharmacy consulted to manage electrolytes in this patient.  Plan:  K 3.1, Mag 1.6.   Will order Magnesium 1 gram IV x 1.  Will order KCL 40meq PO x 1 doses.  Will recheck with AM labs.  Sandy Mitchell, PharmD, BCPS 09/13/2015 11:22 AM

## 2015-09-13 NOTE — Consult Note (Signed)
   [facility] CM Inpatient Consult   09/13/2015  Sandy PaymentKelly Dorce 03/25/1971 [id number]   Went to patient's bedside as a courtesy for [employer] Employees. Patient was noted to be asleep. Link to wellness packet left a bedside with contact information. For questions please contact:  Braydon Kullman RN, BSN [facility]  Hospital Liaison  [phone]) Business Mobile [phone]) Toll free office

## 2015-09-13 NOTE — Progress Notes (Signed)
[facility] [place] [facility]   PATIENT NAME: Sandy Mitchell    MR#:  [id number]  DATE OF BIRTH:  10/18/1970  SUBJECTIVE:    Patient feels better but still has some pain at area of cellulitis. The infected area is still large but better.  REVIEW OF SYSTEMS:    Review of Systems  Constitutional: Negative for fever, chills and malaise/fatigue.  HENT: Negative for ear discharge, ear pain, hearing loss, nosebleeds and sore throat.   Eyes: Negative for blurred vision and pain.  Respiratory: Negative for cough, hemoptysis, shortness of breath and wheezing.   Cardiovascular: Negative for chest pain, palpitations and leg swelling.  Gastrointestinal: Negative for nausea, vomiting, abdominal pain, diarrhea and blood in stool.  Genitourinary: Negative for dysuria.  Musculoskeletal: Negative for back pain.  Skin:       Abdominal cellulitis  Neurological: Negative for dizziness, tremors, speech change, focal weakness, seizures and headaches.  Endo/Heme/Allergies: Does not bruise/bleed easily.  Psychiatric/Behavioral: Negative for depression, suicidal ideas and hallucinations.    Tolerating Diet:yes      DRUG ALLERGIES:   Allergies  Allergen Reactions  . Amlodipine Other (See Comments)    Reaction: extremities get swollen  . Melatonin Other (See Comments)    Reaction: Hallucinations   . Phenergan [Promethazine Hcl] Other (See Comments)    Reaction: Hallucinations  . Trazodone And Nefazodone Other (See Comments)    Reaction: Hallucination  . Atenolol Anxiety  . Nystatin Rash    VITALS:  Blood pressure 125/67, pulse 98, temperature 98.2 F (36.8 C), temperature source Oral, resp. rate 18, height 5\' 3"  (1.6 m), weight 88.451 kg (195 lb), last menstrual period 09/03/2015, SpO2 96 %.  PHYSICAL EXAMINATION:   Physical Exam  Constitutional: She is oriented to person, place, and time and well-developed, well-nourished, and in no distress. No  distress.  HENT:  Head: Normocephalic.  Eyes: No scleral icterus.  Neck: Normal range of motion. Neck supple. No JVD present. No tracheal deviation present.  Cardiovascular: Normal rate, regular rhythm and normal heart sounds.  Exam reveals no gallop and no friction rub.   No murmur heard. Pulmonary/Chest: Effort normal and breath sounds normal. No respiratory distress. She has no wheezes. She has no rales. She exhibits no tenderness.  Abdominal: Soft. Bowel sounds are normal. She exhibits no distension and no mass. There is no tenderness. There is no rebound and no guarding.  Musculoskeletal: Normal range of motion. Trace edema on bilateral upper and lower extremities.  Neurological: She is alert and oriented to person, place, and time.  Skin: Skin is warm. No rash noted. No erythema.  Large Abdominal wall cellulitis on the right side with erythema and warmness with tenderness to palpation.  Psychiatric: Affect and judgment normal.      LABORATORY PANEL:   CBC  Recent Labs Lab 09/13/15 0645  WBC 11.8*  HGB 10.1*  HCT 29.3*  PLT 298   ------------------------------------------------------------------------------------------------------------------  Chemistries   Recent Labs Lab 09/13/15 0645  NA 135  K 3.1*  CL 105  CO2 24  GLUCOSE 126*  BUN <5*  CREATININE 0.64  CALCIUM 7.9*  MG 1.6*   ------------------------------------------------------------------------------------------------------------------  Cardiac Enzymes No results for input(s): TROPONINI in the last 168 hours. ------------------------------------------------------------------------------------------------------------------  RADIOLOGY:  No results found.   ASSESSMENT AND PLAN:    45 y/o female with fibromyalgia here with sepsis and abdominal wall cellulitis.  1. Abdominal wall cellulitis extending to chest wall:  No abscess per CT scan of  abdomen. Continue ZOSYN and VANCOMYCIN. No surgery  at this time per surgeon.  2. SEVERE Sepsis: This is from abdominal wall cellulitis. She presented with hypotension, tachycardia. Improved.  Continue abx as above.   Hypotension improved. discontinue IV fluids.  Lactic acidosis. Improved.  3. Acute renal failure: This is due to hypovolemia. improved with IV fluids.  4. Hyponatremia: This is due to hypovolemia/dehydration.  Improved with NS iv.  5. Hypothyroidism: Continue Synthroid.  6. Hypomagnesemia: Repleted and improved.  * Hypokalemia. KCl. F/u BMP. *Hypomagnesemia. Give IV magnesium and follow-up level.  7. Fibromyalgia: Zanaflex has been discontinued as this can cause hypotension. Continue all other medications.  Management plans discussed with the patient and she is in agreement.  CODE STATUS: FULL  CARETOTAL TIME TAKING CARE OF THIS PATIENT: 37 minutes.    POSSIBLE D/C 2 days, DEPENDING ON CLINICAL CONDITION.   Shaune Pollack M.D on 09/13/2015 at 1:08 PM  Between 7am to 6pm - Pager - [phone] After 6pm go to [email or web address] - password [email or web address]  [facility]  Office  [phone]  CC: Primary care physician; Default, Provider, MD  Note: This dictation was prepared with Dragon dictation along with smaller phrase technology. Any transcriptional errors that result from this process are unintentional.

## 2015-09-14 ENCOUNTER — Inpatient Hospital Stay: Payer: 59

## 2015-09-14 LAB — BASIC METABOLIC PANEL
Anion gap: 10 (ref 5–15)
CHLORIDE: 100 mmol/L — AB (ref 101–111)
CO2: 23 mmol/L (ref 22–32)
CREATININE: 0.75 mg/dL (ref 0.44–1.00)
Calcium: 8.2 mg/dL — ABNORMAL LOW (ref 8.9–10.3)
GFR calc Af Amer: >60 mL/min (ref >60)
GFR calc non Af Amer: >60 mL/min (ref >60)
GLUCOSE: 98 mg/dL (ref 65–99)
POTASSIUM: 3.5 mmol/L (ref 3.5–5.1)
Sodium: 133 mmol/L — ABNORMAL LOW (ref 135–145)

## 2015-09-14 LAB — MAGNESIUM: MAGNESIUM: 1.7 mg/dL (ref 1.7–2.4)

## 2015-09-14 MED ORDER — VANCOMYCIN HCL IN DEXTROSE 1-5 GM/200ML-% IV SOLN
1000.0000 mg | Freq: Three times a day (TID) | INTRAVENOUS | Status: DC
  Administered 2015-09-14 – 2015-09-16 (×5): 1000 mg via INTRAVENOUS
  Filled 2015-09-14 (×8): qty 200

## 2015-09-14 NOTE — Progress Notes (Signed)
[facility] [place] [facility]   PATIENT NAME: Sandy Mitchell    MR#:  [id number]  DATE OF BIRTH:  10/16/1970  SUBJECTIVE:    Patient feels better but still has some pain at area of cellulitis. The infected area is still large.  REVIEW OF SYSTEMS:    Review of Systems  Constitutional: Negative for fever, chills and malaise/fatigue.  HENT: Negative for ear discharge, ear pain, hearing loss, nosebleeds and sore throat.   Eyes: Negative for blurred vision and pain.  Respiratory: Negative for cough, hemoptysis, shortness of breath and wheezing.   Cardiovascular: Negative for chest pain, palpitations and leg swelling.  Gastrointestinal: Negative for nausea, vomiting, abdominal pain, diarrhea and blood in stool.  Genitourinary: Negative for dysuria.  Musculoskeletal: Negative for back pain.  Skin:       Abdominal cellulitis  Neurological: Negative for dizziness, tremors, speech change, focal weakness, seizures and headaches.  Endo/Heme/Allergies: Does not bruise/bleed easily.  Psychiatric/Behavioral: Negative for depression, suicidal ideas and hallucinations.    Tolerating Diet:yes      DRUG ALLERGIES:   Allergies  Allergen Reactions  . Amlodipine Other (See Comments)    Reaction: extremities get swollen  . Melatonin Other (See Comments)    Reaction: Hallucinations   . Phenergan [Promethazine Hcl] Other (See Comments)    Reaction: Hallucinations  . Trazodone And Nefazodone Other (See Comments)    Reaction: Hallucination  . Atenolol Anxiety  . Nystatin Rash    VITALS:  Blood pressure 118/77, pulse 94, temperature 98.4 F (36.9 C), temperature source Oral, resp. rate 17, height 5\' 3"  (1.6 m), weight 88.451 kg (195 lb), last menstrual period 09/03/2015, SpO2 94 %.  PHYSICAL EXAMINATION:   Physical Exam  Constitutional: She is oriented to person, place, and time and well-developed, well-nourished, and in no distress. No distress.  HENT:   Head: Normocephalic.  Eyes: No scleral icterus.  Neck: Normal range of motion. Neck supple. No JVD present. No tracheal deviation present.  Cardiovascular: Normal rate, regular rhythm and normal heart sounds.  Exam reveals no gallop and no friction rub.   No murmur heard. Pulmonary/Chest: Effort normal and breath sounds normal. No respiratory distress. She has no wheezes. She has no rales. She exhibits no tenderness.  Abdominal: Soft. Bowel sounds are normal. She exhibits no distension and no mass. There is no tenderness. There is no rebound and no guarding.  Musculoskeletal: Normal range of motion. Trace edema on bilateral upper and lower extremities.  Neurological: She is alert and oriented to person, place, and time.  Skin: Skin is warm. No rash noted. No erythema.  Large Abdominal wall cellulitis on the right side with erythema and warmness with tenderness to palpation.  Psychiatric: Affect and judgment normal.      LABORATORY PANEL:   CBC  Recent Labs Lab 09/13/15 0645  WBC 11.8*  HGB 10.1*  HCT 29.3*  PLT 298   ------------------------------------------------------------------------------------------------------------------  Chemistries   Recent Labs Lab 09/14/15 0352  NA 133*  K 3.5  CL 100*  CO2 23  GLUCOSE 98  BUN <5*  CREATININE 0.75  CALCIUM 8.2*  MG 1.7   ------------------------------------------------------------------------------------------------------------------  Cardiac Enzymes No results for input(s): TROPONINI in the last 168 hours. ------------------------------------------------------------------------------------------------------------------  RADIOLOGY:  No results found.   ASSESSMENT AND PLAN:    45 y/o female with fibromyalgia here with sepsis and abdominal wall cellulitis.  1. Abdominal wall cellulitis extending to chest wall:  No abscess per CT scan of abdomen. No  surgery at this time per surgeon. She was treated with ZOSYN  and VANCOMYCIN, changed to unasyn.  2. SEVERE Sepsis: This is from abdominal wall cellulitis. She presented with hypotension, tachycardia. Improved.  Continue abx as above.   Hypotension improved. discontinued IV fluids.  Lactic acidosis. Improved.  3. Acute renal failure: This is due to hypovolemia. improved with IV fluids.  4. Hyponatremia: This is due to hypovolemia/dehydration.  Improved with NS iv.  5. Hypothyroidism: Continue Synthroid.  6. Hypomagnesemia: Repleted and improved.  * Hypokalemia. Improved with KCl. *Hypomagnesemia. Improved with IV magnesium.  7. Fibromyalgia: Zanaflex has been discontinued as this can cause hypotension. Continue all other medications.  Management plans discussed with the patient and she is in agreement.  CODE STATUS: FULL  CARETOTAL TIME TAKING CARE OF THIS PATIENT: 33 minutes.    POSSIBLE D/C 2 days, DEPENDING ON CLINICAL CONDITION.   Shaune Pollack M.D on 09/14/2015 at 2:03 PM  Between 7am to 6pm - Pager - [phone] After 6pm go to [email or web address] - password [email or web address]  [facility]  Office  [phone]  CC: Primary care physician; Default, Provider, MD  Note: This dictation was prepared with Dragon dictation along with smaller phrase technology. Any transcriptional errors that result from this process are unintentional.

## 2015-09-14 NOTE — Progress Notes (Signed)
Pharmacy Antibiotic Note  Sandy Mitchell is a 45 y.o. female admitted on 09/09/2015 with cellulitis.  Pharmacy has been consulted for Vancomycin dosing. Patient is currently on Unasyn 3g IV q6h as well. Was on Vancomycin 1g IV q8h previously with therapeutic level.  Plan: Vancomycin 1g IV every 8 hours.  Goal trough 10-15 mcg/mL.  Height: 5\' 3"  (160 cm) Weight: 195 lb (88.451 kg) IBW/kg (Calculated) : 52.4  Temp (24hrs), Avg:98.3 F (36.8 C), Min:98.2 F (36.8 C), Max:98.4 F (36.9 C)   Recent Labs Lab 09/09/15 0812 09/09/15 1640 09/09/15 1910 09/10/15 0221 09/10/15 0550 09/11/15 0506 09/11/15 1920 09/12/15 0518 09/13/15 0645 09/14/15 0352  WBC 19.8*  --   --   --  11.0  --   --   --  11.8*  --   CREATININE 1.65*  --   --   --  0.75 0.74  --  0.65 0.64 0.75  LATICACIDVEN  --  2.7* 2.4* 1.9  --   --   --   --   --   --   VANCOTROUGH  --   --   --   --   --   --  15  --   --   --     Estimated Creatinine Clearance: 93.6 mL/min (by C-G formula based on Cr of 0.75).    Allergies  Allergen Reactions  . Amlodipine Other (See Comments)    Reaction: extremities get swollen  . Melatonin Other (See Comments)    Reaction: Hallucinations   . Phenergan [Promethazine Hcl] Other (See Comments)    Reaction: Hallucinations  . Trazodone And Nefazodone Other (See Comments)    Reaction: Hallucination  . Atenolol Anxiety  . Nystatin Rash    Antimicrobials this admission: Vancomycin 4/6 >> 4/10 Zosyn 4/6 >> 4/10 Unasyn 4/10 >> Vancomycin 4/11 >>  Dose adjustments this admission:  Microbiology results: MRSA PCR: negative  Thank you for allowing pharmacy to be a part of this patient's care.  Clovia CuffLisa Elodia Haviland, PharmD, BCPS 09/14/2015 3:25 PM

## 2015-09-14 NOTE — Consult Note (Signed)
MEDICATION RELATED CONSULT NOTE -follow up  Pharmacy Consult for electrolyte management Indication: hypokalemia, hypomagnesemia  Allergies  Allergen Reactions  . Amlodipine Other (See Comments)    Reaction: extremities get swollen  . Melatonin Other (See Comments)    Reaction: Hallucinations   . Phenergan [Promethazine Hcl] Other (See Comments)    Reaction: Hallucinations  . Trazodone And Nefazodone Other (See Comments)    Reaction: Hallucination  . Atenolol Anxiety  . Nystatin Rash    Patient Measurements: Height: 5\' 3"  (160 cm) Weight: 195 lb (88.451 kg) IBW/kg (Calculated) : 52.4  Vital Signs: Temp: 98.3 F (36.8 C) (04/11 0519) Temp Source: Oral (04/11 0519) BP: 123/68 mmHg (04/11 0519) Pulse Rate: 96 (04/11 0519) Intake/Output from previous day: 04/10 0701 - 04/11 0700 In: 603 [P.O.:600; I.V.:3] Out: 5000 [Urine:5000] Intake/Output from this shift: Total I/O In: 100 [IV Piggyback:100] Out: 700 [Urine:700]  Labs:  Recent Labs  09/12/15 0518 09/13/15 0645 09/14/15 0352  WBC  --  11.8*  --   HGB  --  10.1*  --   HCT  --  29.3*  --   PLT  --  298  --   CREATININE 0.65 0.64 0.75  MG 1.5* 1.6* 1.7   Lab Results  Component Value Date   K 3.5 09/14/2015   Estimated Creatinine Clearance: 93.6 mL/min (by C-G formula based on Cr of 0.75).   Medical History: Past Medical History  Diagnosis Date  . Fibromyalgia   . Migraine   . Thyroid disease   . Migraine   . Chronic neck pain     Medications:  Scheduled:  . ampicillin-sulbactam (UNASYN) IV  3 g Intravenous Q6H  . diclofenac  1 patch Transdermal BID  . enoxaparin (LOVENOX) injection  40 mg Subcutaneous Q24H  . levothyroxine  88 mcg Oral QAC breakfast  . modafinil  200 mg Oral Daily  . nortriptyline  30 mg Oral QHS  . pregabalin  50 mg Oral TID  . sodium chloride flush  3 mL Intravenous Q12H    Assessment: KA is a 45yo female presenting with abdominal wall cellulitis. Pharmacy consulted to  manage electrolytes in this patient.  Plan:  K 3.5 Mag 1.7.   Will recheck with AM labs.  Clovia CuffLisa Raisa Ditto, PharmD, BCPS 09/14/2015 11:33 AM

## 2015-09-14 NOTE — Consult Note (Signed)
[facility] Infectious Disease     Reason for Consult:Abd cellulitis    Referring Physician:  Chen,Q Date of Admission:  09/09/2015   Active Problems:   Sepsis affecting skin   Cellulitis of right abdominal wall   HPI: Sandy Mitchell is a 45 y.o. female with fibromyalgia, chronic pain admitted 4/6 with R abd cellulitis provressive over severeal days. Had been started on bactrim in ED but progressed. On admit was hypotensive, septic with wbc 19.  Initially in ICU, on vanco zosyn clinda. Site improving in size but still persists. WBC 11.8. No fevers. MRSA PCR neg. CT abd neg for intrabd infeciton but has cellulitis. No abscess noted She had a boil on thigh in Jan and told was MRSA but no culture done. Also has been given some steroids for neck spur over last 2 months  Past Medical History  Diagnosis Date  . Fibromyalgia   . Migraine   . Thyroid disease   . Migraine   . Chronic neck pain    Past Surgical History  Procedure Laterality Date  . Tubal ligation     Social History  Substance Use Topics  . Smoking status: Former Research scientist (life sciences)  . Smokeless tobacco: None  . Alcohol Use: No   Family History  Problem Relation Age of Onset  . Healthy Mother   . Kidney failure Father   . Hypertension Father   . Diabetes Father     Allergies:  Allergies  Allergen Reactions  . Amlodipine Other (See Comments)    Reaction: extremities get swollen  . Melatonin Other (See Comments)    Reaction: Hallucinations   . Phenergan [Promethazine Hcl] Other (See Comments)    Reaction: Hallucinations  . Trazodone And Nefazodone Other (See Comments)    Reaction: Hallucination  . Atenolol Anxiety  . Nystatin Rash    Current antibiotics: Antibiotics Given (last 72 hours)    Date/Time Action Medication Dose Rate   09/11/15 2114 Given   vancomycin (VANCOCIN) IVPB 1000 mg/200 mL premix 1,000 mg 200 mL/hr   09/11/15 2120 Given   piperacillin-tazobactam (ZOSYN) IVPB 3.375 g 3.375 g 12.5 mL/hr    09/12/15 0347 Given   vancomycin (VANCOCIN) IVPB 1000 mg/200 mL premix 1,000 mg 200 mL/hr   09/12/15 0504 Given   piperacillin-tazobactam (ZOSYN) IVPB 3.375 g 3.375 g 12.5 mL/hr   09/12/15 1245 Given   vancomycin (VANCOCIN) IVPB 1000 mg/200 mL premix 1,000 mg 200 mL/hr   09/12/15 1451 Given   piperacillin-tazobactam (ZOSYN) IVPB 3.375 g 3.375 g 12.5 mL/hr   09/12/15 2005 Given   vancomycin (VANCOCIN) IVPB 1000 mg/200 mL premix 1,000 mg 200 mL/hr   09/12/15 2353 Given   piperacillin-tazobactam (ZOSYN) IVPB 3.375 g 3.375 g 12.5 mL/hr   09/13/15 0346 Given   vancomycin (VANCOCIN) IVPB 1000 mg/200 mL premix 1,000 mg 200 mL/hr   09/13/15 0554 Given   piperacillin-tazobactam (ZOSYN) IVPB 3.375 g 3.375 g 12.5 mL/hr   09/13/15 1119 Given   vancomycin (VANCOCIN) IVPB 1000 mg/200 mL premix 1,000 mg 200 mL/hr   09/13/15 1901 Given   Ampicillin-Sulbactam (UNASYN) 3 g in sodium chloride 0.9 % 100 mL IVPB 3 g 100 mL/hr   09/14/15 0144 Given   Ampicillin-Sulbactam (UNASYN) 3 g in sodium chloride 0.9 % 100 mL IVPB 3 g 100 mL/hr   09/14/15 0555 Given   Ampicillin-Sulbactam (UNASYN) 3 g in sodium chloride 0.9 % 100 mL IVPB 3 g 100 mL/hr      MEDICATIONS: . ampicillin-sulbactam (UNASYN) IV  3  g Intravenous Q6H  . diclofenac  1 patch Transdermal BID  . enoxaparin (LOVENOX) injection  40 mg Subcutaneous Q24H  . levothyroxine  88 mcg Oral QAC breakfast  . modafinil  200 mg Oral Daily  . nortriptyline  30 mg Oral QHS  . pregabalin  50 mg Oral TID  . sodium chloride flush  3 mL Intravenous Q12H    Review of Systems - 11 systems reviewed and negative per HPI   OBJECTIVE: Temp:  [98.2 F (36.8 C)-98.4 F (36.9 C)] 98.4 F (36.9 C) (04/11 1225) Pulse Rate:  [93-96] 94 (04/11 1225) Resp:  [16-17] 17 (04/11 1225) BP: (118-128)/(68-77) 118/77 mmHg (04/11 1225) SpO2:  [94 %-100 %] 94 % (04/11 1225) Physical Exam  Constitutional:  oriented to person, place, and time. appears well-developed and  well-nourished. No distress. Obese, lying in bed, mildly ill appearing HENT: [place]/AT, PERRLA, no scleral icterus Mouth/Throat: Oropharynx is clear and moist. No oropharyngeal exudate.  Cardiovascular: Normal rate, regular rhythm and normal heart sounds.  Pulmonary/Chest: Effort normal and breath sounds normal. No respiratory distress.  has no wheezes.  Neck  supple, no nuchal rigidity Abdominal: obese, soft, R flank with football sized area of induration and redness, moderately tender to palpitation, surrounding erythema Lymphadenopathy: no cervical adenopathy. No axillary adenopathy Neurological: alert and oriented to person, place, and time.  Skin: cellulitis as above Psychiatric: a normal mood and affect.  behavior is normal.    LABS: Results for orders placed or performed during the hospital encounter of 09/09/15 (from the past 48 hour(s))  Magnesium     Status: Abnormal   Collection Time: 09/13/15  6:45 AM  Result Value Ref Range   Magnesium 1.6 (L) 1.7 - 2.4 mg/dL  Basic metabolic panel     Status: Abnormal   Collection Time: 09/13/15  6:45 AM  Result Value Ref Range   Sodium 135 135 - 145 mmol/L   Potassium 3.1 (L) 3.5 - 5.1 mmol/L   Chloride 105 101 - 111 mmol/L   CO2 24 22 - 32 mmol/L   Glucose, Bld 126 (H) 65 - 99 mg/dL   BUN <5 (L) 6 - 20 mg/dL   Creatinine, Ser 0.64 0.44 - 1.00 mg/dL   Calcium 7.9 (L) 8.9 - 10.3 mg/dL   GFR calc non Af Amer >60 >60 mL/min   GFR calc Af Amer >60 >60 mL/min    Comment: (NOTE) The eGFR has been calculated using the CKD EPI equation. This calculation has not been validated in all clinical situations. eGFR's persistently <60 mL/min signify possible Chronic Kidney Disease.    Anion gap 6 5 - 15  CBC     Status: Abnormal   Collection Time: 09/13/15  6:45 AM  Result Value Ref Range   WBC 11.8 (H) 3.6 - 11.0 K/uL   RBC 3.62 (L) 3.80 - 5.20 MIL/uL   Hemoglobin 10.1 (L) 12.0 - 16.0 g/dL   HCT 29.3 (L) 35.0 - 47.0 %   MCV 80.9 80.0 - 100.0  fL   MCH 28.0 26.0 - 34.0 pg   MCHC 34.6 32.0 - 36.0 g/dL   RDW 14.4 11.5 - 14.5 %   Platelets 298 150 - 440 K/uL  Basic metabolic panel     Status: Abnormal   Collection Time: 09/14/15  3:52 AM  Result Value Ref Range   Sodium 133 (L) 135 - 145 mmol/L   Potassium 3.5 3.5 - 5.1 mmol/L   Chloride 100 (L) 101 - 111 mmol/L  CO2 23 22 - 32 mmol/L   Glucose, Bld 98 65 - 99 mg/dL   BUN <5 (L) 6 - 20 mg/dL   Creatinine, Ser 0.75 0.44 - 1.00 mg/dL   Calcium 8.2 (L) 8.9 - 10.3 mg/dL   GFR calc non Af Amer >60 >60 mL/min   GFR calc Af Amer >60 >60 mL/min    Comment: (NOTE) The eGFR has been calculated using the CKD EPI equation. This calculation has not been validated in all clinical situations. eGFR's persistently <60 mL/min signify possible Chronic Kidney Disease.    Anion gap 10 5 - 15  Magnesium     Status: None   Collection Time: 09/14/15  3:52 AM  Result Value Ref Range   Magnesium 1.7 1.7 - 2.4 mg/dL   No components found for: ESR, C REACTIVE PROTEIN MICRO: Recent Results (from the past 720 hour(s))  MRSA PCR Screening     Status: None   Collection Time: 09/09/15  2:11 PM  Result Value Ref Range Status   MRSA by PCR NEGATIVE NEGATIVE Final    Comment:        The GeneXpert MRSA Assay (FDA approved for NASAL specimens only), is one component of a comprehensive MRSA colonization surveillance program. It is not intended to diagnose MRSA infection nor to guide or monitor treatment for MRSA infections.     IMAGING: Dg Cervical Spine Complete  09/04/2015  CLINICAL DATA:  Left neck and shoulder pain for 2 months. No known injury EXAM: CERVICAL SPINE - COMPLETE 4+ VIEW COMPARISON:  None. FINDINGS: There is no evidence of cervical spine fracture or prevertebral soft tissue swelling. Alignment is normal. Mild anterior endplate osteophyte formation is seen at C5-6 without significant disc space narrowing. No other significant bone abnormality identified. IMPRESSION: No acute  findings. Mild vertebral endplate osteophyte formation at C5-6. Electronically Signed   By: Earle Gell M.D.   On: 09/04/2015 09:07   Ct Abdomen Pelvis W Contrast  09/10/2015  CLINICAL DATA:  Right-sided abdominal pain EXAM: CT ABDOMEN AND PELVIS WITH CONTRAST TECHNIQUE: Multidetector CT imaging of the abdomen and pelvis was performed using the standard protocol following bolus administration of intravenous contrast. CONTRAST:  167m ISOVUE-300 IOPAMIDOL (ISOVUE-300) INJECTION 61% COMPARISON:  None. FINDINGS: Lung bases demonstrate small bilateral pleural effusions and bibasilar infiltrate. The liver, gallbladder, spleen, adrenal glands and pancreas are within normal limits. The kidneys are well visualized and reveal a normal enhancement pattern. No abnormal mass or abnormal calcifications are seen. No obstructive changes are noted. The appendix is well visualized and within normal limits. No significant diverticular change of the colon is noted. The uterus and ovaries are unremarkable. Minimal free fluid is noted within the pelvis this may be physiologic in nature. The bladder is well distended. No bony abnormality is seen. Significant subcutaneous edema and skin thickening is noted in the abdominal wall particularly on the right. No focal abscess is identified. IMPRESSION: Changes consistent with cellulitis and edema in the abdominal wall particularly on the right. No focal abscess is seen. No other acute abnormality within the abdomen and pelvis is seen. Electronically Signed   By: MInez CatalinaM.D.   On: 09/10/2015 18:58    Assessment:   KMilina Pagettis a 45y.o. female with large R abd wall abscess, slowly responding to IV abx. Her MRSA PCR is neg but this does not rule out MRSA as cause. Her CT done several days ago did not show drainable abscess but it may have coalesced  now  Recommendations Restart vanco Cont unasyn Check soft tissue USS- if fluid collection will need to be drained.  If now  fluid collection will need to just continue IV abx  Thank you very much for allowing me to participate in the care of this patient. Please call with questions.   Cheral Marker. Ola Spurr, MD

## 2015-09-15 LAB — BASIC METABOLIC PANEL
ANION GAP: 7 (ref 5–15)
BUN: 6 mg/dL (ref 6–20)
CALCIUM: 8.3 mg/dL — AB (ref 8.9–10.3)
CO2: 27 mmol/L (ref 22–32)
Chloride: 102 mmol/L (ref 101–111)
Creatinine, Ser: 0.95 mg/dL (ref 0.44–1.00)
GFR calc Af Amer: >60 mL/min (ref >60)
Glucose, Bld: 154 mg/dL — ABNORMAL HIGH (ref 65–99)
POTASSIUM: 3.4 mmol/L — AB (ref 3.5–5.1)
SODIUM: 136 mmol/L (ref 135–145)

## 2015-09-15 LAB — MAGNESIUM: MAGNESIUM: 1.9 mg/dL (ref 1.7–2.4)

## 2015-09-15 MED ORDER — NAPROXEN 250 MG PO TABS
500.0000 mg | ORAL_TABLET | Freq: Three times a day (TID) | ORAL | Status: DC | PRN
  Administered 2015-09-15: 500 mg via ORAL
  Filled 2015-09-15: qty 2

## 2015-09-15 MED ORDER — POTASSIUM CHLORIDE CRYS ER 20 MEQ PO TBCR
20.0000 meq | EXTENDED_RELEASE_TABLET | Freq: Once | ORAL | Status: AC
  Administered 2015-09-15: 20 meq via ORAL
  Filled 2015-09-15: qty 1

## 2015-09-15 NOTE — Care Management (Signed)
Attending states that patient is improving and would anticipate discharge within the next 24-48 hours.  There is not going to be a need for home IV antibiotic.  Care team members have not identified any discharge needs at present.

## 2015-09-15 NOTE — Progress Notes (Signed)
[facility] [place] [facility]   PATIENT NAME: Sandy Mitchell    MR#:  [id number]  DATE OF BIRTH:  [birth year]  SUBJECTIVE:    Patient feels better but still has some pain at area of cellulitis. The infected area is better than yesterday.  REVIEW OF SYSTEMS:    Review of Systems  Constitutional: Negative for fever, chills and malaise/fatigue.  HENT: Negative for ear discharge, ear pain, hearing loss, nosebleeds and sore throat.   Eyes: Negative for blurred vision and pain.  Respiratory: Negative for cough, hemoptysis, shortness of breath and wheezing.   Cardiovascular: Negative for chest pain, palpitations and leg swelling.  Gastrointestinal: Negative for nausea, vomiting, abdominal pain, diarrhea and blood in stool.  Genitourinary: Negative for dysuria.  Musculoskeletal: Negative for back pain.  Skin:       Abdominal cellulitis  Neurological: Negative for dizziness, tremors, speech change, focal weakness, seizures and headaches.  Endo/Heme/Allergies: Does not bruise/bleed easily.  Psychiatric/Behavioral: Negative for depression, suicidal ideas and hallucinations.    Tolerating Diet:yes      DRUG ALLERGIES:   Allergies  Allergen Reactions  . Amlodipine Other (See Comments)    Reaction: extremities get swollen  . Melatonin Other (See Comments)    Reaction: Hallucinations   . Phenergan [Promethazine Hcl] Other (See Comments)    Reaction: Hallucinations  . Trazodone And Nefazodone Other (See Comments)    Reaction: Hallucination  . Atenolol Anxiety  . Nystatin Rash    VITALS:  Blood pressure 111/62, pulse 94, temperature 98.4 F (36.9 C), temperature source Oral, resp. rate 18, height  (1.6 m), weight 88.451 kg (195 lb), last menstrual period 09/03/2015, SpO2 96 %.  PHYSICAL EXAMINATION:   Physical Exam  Constitutional: She is oriented to person, place, and time and well-developed, well-nourished, and in no distress. No  distress.  HENT:  Head: Normocephalic.  Eyes: No scleral icterus.  Neck: Normal range of motion. Neck supple. No JVD present. No tracheal deviation present.  Cardiovascular: Normal rate, regular rhythm and normal heart sounds.  Exam reveals no gallop and no friction rub.   No murmur heard. Pulmonary/Chest: Effort normal and breath sounds normal. No respiratory distress. She has no wheezes. She has no rales. She exhibits no tenderness.  Abdominal: Soft. Bowel sounds are normal. She exhibits no distension and no mass. There is no tenderness. There is no rebound and no guarding.  Musculoskeletal: Normal range of motion. Trace edema on bilateral upper and lower extremities.  Neurological: She is alert and oriented to person, place, and time.  Skin: Skin is warm. No rash noted. No erythema.  Large Abdominal wall cellulitis on the right side with erythema and warmness with tenderness to palpation. Erythema area is smaller than yesterday. Psychiatric: Affect and judgment normal.      LABORATORY PANEL:   CBC  Recent Labs Lab 09/13/15 0645  WBC 11.8*  HGB 10.1*  HCT 29.3*  PLT 298   ------------------------------------------------------------------------------------------------------------------  Chemistries   Recent Labs Lab 09/14/15 0352  NA 133*  K 3.5  CL 100*  CO2 23  GLUCOSE 98  BUN <5*  CREATININE 0.75  CALCIUM 8.2*  MG 1.7   ------------------------------------------------------------------------------------------------------------------  Cardiac Enzymes No results for input(s): TROPONINI in the last 168 hours. ------------------------------------------------------------------------------------------------------------------  RADIOLOGY:  US Abdomen Limited  09/14/2015  CLINICAL DATA:  Cellulitis of the abdominal wall/right flank, evaluate for drainable fluid collection or abscess EXAM: LIMITED ULTRASOUND OF ABDOMINAL SOFT TISSUES TECHNIQUE: Ultrasound examination  of the  abdominal wall soft tissues was performed in the area of clinical concern. COMPARISON:  CT abdomen/ pelvis dated 09/10/2015 FINDINGS: Target ultrasound performed in the area of clinical concern (right lateral abdominal wall/ flank). Subcutaneous edema is present. No drainable fluid collection/abscess is visualized. IMPRESSION: No drainable fluid collection/abscess is visualized. Electronically Signed   By: Charline BillsSriyesh  Krishnan M.D.   On: 09/14/2015 16:50     ASSESSMENT AND PLAN:    45 y/o female with fibromyalgia here with sepsis and abdominal wall cellulitis.  1. Abdominal wall cellulitis extending to chest wall:  No abscess per CT scan of abdomen. No surgery at this time per surgeon. She was treated with ZOSYN and VANCOMYCIN, changed to unasyn. Per Dr. Sampson GoonFitzgerald, abdominal wall US ( no abscess), continue unasyn and vancomycin.  2. SEVERE Sepsis: This is from abdominal wall cellulitis. She presented with hypotension, tachycardia. Improved.  Continue abx as above.   Hypotension improved. discontinued IV fluids.  Lactic acidosis. Improved.  3. Acute renal failure: This is due to hypovolemia. improved with IV fluids.  4. Hyponatremia: This is due to hypovolemia/dehydration.  Improved with NS iv.  5. Hypothyroidism: Continue Synthroid.  6. Hypomagnesemia: Repleted and improved.  * Hypokalemia. Improved with KCl. *Hypomagnesemia. Improved with IV magnesium.  7. Fibromyalgia: Zanaflex has been discontinued as this can cause hypotension. Continue all other medications.  Discussed with Dr. Sampson GoonFitzgerald. Management plans discussed with the patient and she is in agreement.  CODE STATUS: FULL  CARETOTAL TIME TAKING CARE OF THIS PATIENT: 33 minutes.    POSSIBLE D/C 2 days, DEPENDING ON CLINICAL CONDITION.   Shaune Pollackhen, Beckett Maden M.D on 09/15/2015 at 11:06 AM  Between 7am to 6pm - Pager - [phone] After 6pm go to [email or web address] - password [email or web address]  [facility]io [facility]ors[facility]   Office  [phone]  CC: Primary care physician; Default, Provider, MD  Note: This dictation was prepared with Dragon dictation along with smaller phrase technology. Any transcriptional errors that result from this process are unintentional.

## 2015-09-15 NOTE — Progress Notes (Signed)
45 yr old female with cellulitus of right flank.  He states that the area has much improved and that is much smaller than previously. Patient states that right over the area still tender and hard. Patient has not had any drainage in the area. Patient does not remember having any trauma wound to the area or any bite to the area.  She does have a history of MRSA per the patient.   Filed Vitals:   09/15/15 0025 09/15/15 1414  BP: 111/62 115/68  Pulse: 94 99  Temp: 98.4 F (36.9 C)   Resp: 18 16   I/O last 3 completed shifts: In: 1227.5 [P.O.:840; IV Piggyback:387.5] Out: 5175 [Urine:5175]     PE:  Gen: NAD Res: CTAB/L  Cardio: RRR, no murmur Abd: soft, obese, 5cm area of cellulitus with 4cm area of induration, much improved from previous markings and pictures, no fluctuance, no drainage Ext: 2+ pulses  CBC Latest Ref Rng 09/13/2015 09/10/2015 09/09/2015  WBC 3.6 - 11.0 K/uL 11.8(H) 11.0 19.8(H)  Hemoglobin 12.0 - 16.0 g/dL 10.1(L) 9.4(L) 11.7(L)  Hematocrit 35.0 - 47.0 % 29.3(L) 27.4(L) 34.4(L)  Platelets 150 - 440 K/uL 298 179 261   CMP Latest Ref Rng 09/15/2015 09/14/2015 09/13/2015  Glucose 65 - 99 mg/dL 161(W154(H) 98 960(A126(H)  BUN 6 - 20 mg/dL 6 <5(W<5(L) <0(J<5(L)  Creatinine 0.44 - 1.00 mg/dL 8.110.95 9.140.75 7.820.64  Sodium 135 - 145 mmol/L 136 133(L) 135  Potassium 3.5 - 5.1 mmol/L 3.4(L) 3.5 3.1(L)  Chloride 101 - 111 mmol/L 102 100(L) 105  CO2 22 - 32 mmol/L 27 23 24   Calcium 8.9 - 10.3 mg/dL 8.3(L) 8.2(L) 7.9(L)    A/P  45 yr old female with abdominal wall cellulitis.  I have personally reviewed the patient's CT scan images that were gotten on 4/7, which showed some edema and stranding over the right lateral abdomen and flank going down to her abdominal muscle layer however no frank abscess or fluid seen. I also reviewed her ultrasound from 4/11 which did not show any frank fluid collection either. I discussed with the patient that given her history of MRSA in the way this looks of this likely does  have a component of MRSA which doesn't always have a frank abscess cavity. I discussed with her the possibility of doing an incision and drainage in the area which given that there is no fluid may or may not help improve the area. I also discussed with the patient that sometimes the imagining doesn't pick up on these abscess cavities and areas every time.  Patient would like to avoid any operation if at all possible, she feels as if it is improving with the antibiotics and would like to stick with that course. Patient is open to discussing incision and drainage if it worsens. We will continue to follow along.

## 2015-09-15 NOTE — Progress Notes (Signed)
Pharmacy Consult for electrolyte supplementation  Allergies  Allergen Reactions  . Amlodipine Other (See Comments)    Reaction: extremities get swollen  . Melatonin Other (See Comments)    Reaction: Hallucinations   . Phenergan [Promethazine Hcl] Other (See Comments)    Reaction: Hallucinations  . Trazodone And Nefazodone Other (See Comments)    Reaction: Hallucination  . Atenolol Anxiety  . Nystatin Rash   Labs:  Recent Labs  09/13/15 0645  WBC 11.8*  HGB 10.1*  HCT 29.3*  PLT 298     Recent Labs  09/13/15 0645 09/14/15 0352 09/15/15 1025  NA 135 133* 136  K 3.1* 3.5 3.4*  CL 105 100* 102  CO2 24 23 27   GLUCOSE 126* 98 154*  BUN <5* <5* 6  CREATININE 0.64 0.75 0.95  CALCIUM 7.9* 8.2* 8.3*  MG 1.6* 1.7 1.9   Estimated Creatinine Clearance: 78.9 mL/min (by C-G formula based on Cr of 0.95).   No results for input(s): GLUCAP in the last 72 hours.  Plan:  Electrolytes WNL except potassium. Will give KCl 20 mEq PO x 1, recheck with AM labs  Rachael Zapanta C 09/15/2015,1:30 PM

## 2015-09-15 NOTE — Progress Notes (Signed)
[facility] CLINIC INFECTIOUS DISEASE PROGRESS NOTE Date of Admission:  09/09/2015     ID: Sandy Mitchell is a 45 y.o. female with abd wall cellulitis  Active Problems:   Sepsis affecting skin   Cellulitis of right abdominal wall   Subjective: No fevers, she thinks a little bit better but site still impressive. Tender still  ROS  Eleven systems are reviewed and negative except per hpi  Medications:  Antibiotics Given (last 72 hours)    Date/Time Action Medication Dose Rate   09/12/15 1451 Given   piperacillin-tazobactam (ZOSYN) IVPB 3.375 g 3.375 g 12.5 mL/hr   09/12/15 2005 Given   vancomycin (VANCOCIN) IVPB 1000 mg/200 mL premix 1,000 mg 200 mL/hr   09/12/15 2353 Given   piperacillin-tazobactam (ZOSYN) IVPB 3.375 g 3.375 g 12.5 mL/hr   09/13/15 0346 Given   vancomycin (VANCOCIN) IVPB 1000 mg/200 mL premix 1,000 mg 200 mL/hr   09/13/15 0554 Given   piperacillin-tazobactam (ZOSYN) IVPB 3.375 g 3.375 g 12.5 mL/hr   09/13/15 1119 Given   vancomycin (VANCOCIN) IVPB 1000 mg/200 mL premix 1,000 mg 200 mL/hr   09/13/15 1901 Given   Ampicillin-Sulbactam (UNASYN) 3 g in sodium chloride 0.9 % 100 mL IVPB 3 g 100 mL/hr   09/14/15 0144 Given   Ampicillin-Sulbactam (UNASYN) 3 g in sodium chloride 0.9 % 100 mL IVPB 3 g 100 mL/hr   09/14/15 0555 Given   Ampicillin-Sulbactam (UNASYN) 3 g in sodium chloride 0.9 % 100 mL IVPB 3 g 100 mL/hr   09/14/15 1200 Given   Ampicillin-Sulbactam (UNASYN) 3 g in sodium chloride 0.9 % 100 mL IVPB 3 g 100 mL/hr   09/14/15 1700 Given   vancomycin (VANCOCIN) IVPB 1000 mg/200 mL premix 1,000 mg 200 mL/hr   09/14/15 1800 Given   Ampicillin-Sulbactam (UNASYN) 3 g in sodium chloride 0.9 % 100 mL IVPB 3 g 100 mL/hr   09/14/15 2350 Given   Ampicillin-Sulbactam (UNASYN) 3 g in sodium chloride 0.9 % 100 mL IVPB 3 g 100 mL/hr   09/15/15 0133 Given   vancomycin (VANCOCIN) IVPB 1000 mg/200 mL premix 1,000 mg 200 mL/hr   09/15/15 0517 Given   Ampicillin-Sulbactam  (UNASYN) 3 g in sodium chloride 0.9 % 100 mL IVPB 3 g 100 mL/hr   09/15/15 0836 Given   vancomycin (VANCOCIN) IVPB 1000 mg/200 mL premix 1,000 mg 200 mL/hr   09/15/15 1204 Given   Ampicillin-Sulbactam (UNASYN) 3 g in sodium chloride 0.9 % 100 mL IVPB 3 g 100 mL/hr     . ampicillin-sulbactam (UNASYN) IV  3 g Intravenous Q6H  . diclofenac  1 patch Transdermal BID  . enoxaparin (LOVENOX) injection  40 mg Subcutaneous Q24H  . levothyroxine  88 mcg Oral QAC breakfast  . modafinil  200 mg Oral Daily  . nortriptyline  30 mg Oral QHS  . potassium chloride  20 mEq Oral Once  . pregabalin  50 mg Oral TID  . sodium chloride flush  3 mL Intravenous Q12H  . vancomycin  1,000 mg Intravenous Q8H    Objective: Vital signs in last 24 hours: Temp:  [97.8 F (36.6 C)-98.4 F (36.9 C)] 98.4 F (36.9 C) (04/12 0025) Pulse Rate:  [94-104] 94 (04/12 0025) Resp:  [18] 18 (04/12 0025) BP: (105-111)/(62-63) 111/62 mmHg (04/12 0025) SpO2:  [96 %-100 %] 96 % (04/12 0025) Constitutional: oriented to person, place, and time. appears well-developed and well-nourished. No distress. Obese, lying in bed, mildly ill appearing HENT: [place]/AT, PERRLA, no scleral icterus Mouth/Throat: Oropharynx  is clear and moist. No oropharyngeal exudate.  Cardiovascular: Normal rate, regular rhythm and normal heart sounds.  Pulmonary/Chest: Effort normal and breath sounds normal. No respiratory distress. has no wheezes.  Neck supple, no nuchal rigidity Abdominal: obese, soft, R flank with football sized area of induration and redness, moderately tender to palpitation, surrounding erythema Lymphadenopathy: no cervical adenopathy. No axillary adenopathy Neurological: alert and oriented to person, place, and time.  Skin: cellulitis as above Psychiatric: a normal mood and affect. behavior is normal.   Lab Results  Recent Labs  09/13/15 0645 09/14/15 0352 09/15/15 1025  WBC 11.8*  --   --   HGB 10.1*  --   --   HCT  29.3*  --   --   NA 135 133* 136  K 3.1* 3.5 3.4*  CL 105 100* 102  CO2 24 23 27   BUN <5* <5* 6  CREATININE 0.64 0.75 0.95    Microbiology: Results for orders placed or performed during the hospital encounter of 09/09/15  MRSA PCR Screening     Status: None   Collection Time: 09/09/15  2:11 PM  Result Value Ref Range Status   MRSA by PCR NEGATIVE NEGATIVE Final    Comment:        The GeneXpert MRSA Assay (FDA approved for NASAL specimens only), is one component of a comprehensive MRSA colonization surveillance program. It is not intended to diagnose MRSA infection nor to guide or monitor treatment for MRSA infections.     Studies/Results: [place] Abdomen Limited  09/14/2015  CLINICAL DATA:  Cellulitis of the abdominal wall/right flank, evaluate for drainable fluid collection or abscess EXAM: LIMITED ULTRASOUND OF ABDOMINAL SOFT TISSUES TECHNIQUE: Ultrasound examination of the abdominal wall soft tissues was performed in the area of clinical concern. COMPARISON:  CT abdomen/ pelvis dated 09/10/2015 FINDINGS: Target ultrasound performed in the area of clinical concern (right lateral abdominal wall/ flank). Subcutaneous edema is present. No drainable fluid collection/abscess is visualized. IMPRESSION: No drainable fluid collection/abscess is visualized. Electronically Signed   By: Charline BillsSriyesh  Krishnan M.D.   On: 09/14/2015 16:50    Assessment/Plan: Sandy Mitchell is a 45 y.o. female with large R abd wall abscess, slowly responding to IV abx. Her MRSA PCR is neg but this does not rule out MRSA as cause. Her CT done several days ago did not show drainable abscess and USS done 4/11 does not show drainable fluid collection but the tissue area is tense and tender  Recommendations Cont vanco Cont unasyn Day7 of admission and broad spectrum abx. I have asked surgery to evaluate to see if surgery would be appropriate Thank you very much for the consult. Will follow with you.  Sandy Mitchell,  Sandy Mitchell   09/15/2015, 1:54 PM

## 2015-09-16 LAB — CBC
HEMATOCRIT: 30.5 % — AB (ref 35.0–47.0)
Hemoglobin: 10.6 g/dL — ABNORMAL LOW (ref 12.0–16.0)
MCH: 28.3 pg (ref 26.0–34.0)
MCHC: 34.7 g/dL (ref 32.0–36.0)
MCV: 81.6 fL (ref 80.0–100.0)
Platelets: 335 10*3/uL (ref 150–440)
RBC: 3.74 MIL/uL — AB (ref 3.80–5.20)
RDW: 13.8 % (ref 11.5–14.5)
WBC: 10.5 10*3/uL (ref 3.6–11.0)

## 2015-09-16 LAB — POTASSIUM: Potassium: 3.5 mmol/L (ref 3.5–5.1)

## 2015-09-16 LAB — CREATININE, SERUM
CREATININE: 0.84 mg/dL (ref 0.44–1.00)
GFR calc Af Amer: >60 mL/min (ref >60)
GFR calc non Af Amer: >60 mL/min (ref >60)

## 2015-09-16 LAB — VANCOMYCIN, TROUGH: Vancomycin Tr: 31 ug/mL (ref 10–20)

## 2015-09-16 LAB — PHOSPHORUS: Phosphorus: 4.8 mg/dL — ABNORMAL HIGH (ref 2.5–4.6)

## 2015-09-16 LAB — VANCOMYCIN, RANDOM: Vancomycin Rm: 19 ug/mL

## 2015-09-16 MED ORDER — VANCOMYCIN HCL IN DEXTROSE 1-5 GM/200ML-% IV SOLN
1000.0000 mg | Freq: Two times a day (BID) | INTRAVENOUS | Status: DC
  Administered 2015-09-16 – 2015-09-17 (×2): 1000 mg via INTRAVENOUS
  Filled 2015-09-16 (×4): qty 200

## 2015-09-16 NOTE — Progress Notes (Signed)
45 yr old female with cellulitus of right flank.  Patient states that the area is still tender but she feels as if it's improving even more today. Patient feels as if the area is softer today as well. Patient denies any fever chills or pain outside the area.  Filed Vitals:   09/16/15 0429 09/16/15 1757  BP: 117/67 113/72  Pulse: 72 85  Temp: 98 F (36.7 C) 98 F (36.7 C)  Resp: 18 16   I/O last 3 completed shifts: In: 787.5 [P.O.:600; IV Piggyback:187.5] Out: 4475 [Urine:4475] Total I/O In: 440 [P.O.:440] Out: 1400 [Urine:1400]   PE:  Gen: NAD Res: CTAB/L  Cardio: RRR, no murmur Abd: soft, obese, 4cm area of cellulitus with 3cm area of induration,slight improvement from yesterday no fluctuance, no drainage Ext: 2+ pulses  CBC Latest Ref Rng 09/16/2015 09/13/2015 09/10/2015  WBC 3.6 - 11.0 K/uL 10.5 11.8(H) 11.0  Hemoglobin 12.0 - 16.0 g/dL 10.6(L) 10.1(L) 9.4(L)  Hematocrit 35.0 - 47.0 % 30.5(L) 29.3(L) 27.4(L)  Platelets 150 - 440 K/uL 335 298 179   CMP Latest Ref Rng 09/16/2015 09/15/2015 09/14/2015  Glucose 65 - 99 mg/dL - 161(W154(H) 98  BUN 6 - 20 mg/dL - 6 <9(U<5(L)  Creatinine 0.450.44 - 1.00 mg/dL 4.090.84 8.110.95 9.140.75  Sodium 135 - 145 mmol/L - 136 133(L)  Potassium 3.5 - 5.1 mmol/L 3.5 3.4(L) 3.5  Chloride 101 - 111 mmol/L - 102 100(L)  CO2 22 - 32 mmol/L - 27 23  Calcium 8.9 - 10.3 mg/dL - 8.3(L) 8.2(L)    A/P  45 yr old female with abdominal wall cellulitis. I again went over the option of potential incision and drainage with the patient which the patient would much rather avoid at this point. The area does seem to be continuing to slowly improve with IV antibiotics. It appears as if ID is okay for her to go home in the next couple days with by mouth Augmentin and Doxy and possibly PICC line and IV antibiotics if worsens. I believe this is a reasonable plan, however if does worsen and needs an I&D surgery is available.

## 2015-09-16 NOTE — Progress Notes (Signed)
Pharmacy Antibiotic Note  Sandy Mitchell is a 45 y.o. female admitted on 09/09/2015 with cellulitis and abscess.  Pharmacy has been consulted for Vancomycin dosing. Patient is currently on Unasyn 3g IV q6h as well. Was on Vancomycin 1g IV q8h previously with therapeutic level.  Plan: Elevated vancomycin trough at 31. Will reduce dose to 1gm IV Q12H. Per surgical/ID notes patient now with abscess. Will target trough of 15-4120mcg/ml. Will check trough prior to 4th dose of this regimen, which should be at steady state.   Height: 5\' 3"  (160 cm) Weight: 195 lb (88.451 kg) IBW/kg (Calculated) : 52.4  Temp (24hrs), Avg:98.2 F (36.8 C), Min:98 F (36.7 C), Max:98.3 F (36.8 C)   Recent Labs Lab 09/09/15 1640 09/09/15 1910 09/10/15 0221 09/10/15 0550  09/11/15 1920 09/12/15 0518 09/13/15 0645 09/14/15 0352 09/15/15 1025 09/16/15 0452 09/16/15 0804 09/16/15 1143  WBC  --   --   --  11.0  --   --   --  11.8*  --   --  10.5  --   --   CREATININE  --   --   --  0.75  < >  --  0.65 0.64 0.75 0.95 0.84  --   --   LATICACIDVEN 2.7* 2.4* 1.9  --   --   --   --   --   --   --   --   --   --   VANCOTROUGH  --   --   --   --   --  15  --   --   --   --   --  31*  --   VANCORANDOM  --   --   --   --   --   --   --   --   --   --   --   --  19  < > = values in this interval not displayed.  Estimated Creatinine Clearance: 89.2 mL/min (by C-G formula based on Cr of 0.84).    Allergies  Allergen Reactions  . Amlodipine Other (See Comments)    Reaction: extremities get swollen  . Melatonin Other (See Comments)    Reaction: Hallucinations   . Phenergan [Promethazine Hcl] Other (See Comments)    Reaction: Hallucinations  . Trazodone And Nefazodone Other (See Comments)    Reaction: Hallucination  . Atenolol Anxiety  . Nystatin Rash    Antimicrobials this admission: Vancomycin 4/6 >> 4/10 Zosyn 4/6 >> 4/10 Unasyn 4/10 >> Vancomycin 4/11 >>  Dose adjustments this admission: 4/13,  reduced vancomycin from 1gm Q8H to 1gm Q12H  Microbiology results: MRSA PCR: negative  Thank you for allowing pharmacy to be a part of this patient's care.  Garlon HatchetJody Nana Vastine, PharmD Clinical Pharmacist  09/16/2015 1:46 PM

## 2015-09-16 NOTE — Progress Notes (Signed)
Pharmacy Consult for electrolyte supplementation  Allergies  Allergen Reactions  . Amlodipine Other (See Comments)    Reaction: extremities get swollen  . Melatonin Other (See Comments)    Reaction: Hallucinations   . Phenergan [Promethazine Hcl] Other (See Comments)    Reaction: Hallucinations  . Trazodone And Nefazodone Other (See Comments)    Reaction: Hallucination  . Atenolol Anxiety  . Nystatin Rash   Labs:  Recent Labs  09/16/15 0452  WBC 10.5  HGB 10.6*  HCT 30.5*  PLT 335     Recent Labs  09/14/15 0352 09/15/15 1025 09/16/15 0452  NA 133* 136  --   K 3.5 3.4* 3.5  CL 100* 102  --   CO2 23 27  --   GLUCOSE 98 154*  --   BUN <5* 6  --   CREATININE 0.75 0.95 0.84  CALCIUM 8.2* 8.3*  --   MG 1.7 1.9  --   PHOS  --   --  4.8*   Estimated Creatinine Clearance: 89.2 mL/min (by C-G formula based on Cr of 0.84).   No results for input(s): GLUCAP in the last 72 hours.  Plan:  No supplementation required at this time, recheck with AM labs  Sandy Mitchell C 09/16/2015,10:02 AM

## 2015-09-16 NOTE — Progress Notes (Signed)
[facility] Date of Admission:  09/09/2015     ID: Deborha PaymentKelly Tatro is a 45 y.o. female with abd wall cellulitis  Active Problems:   Sepsis affecting skin   Cellulitis of right abdominal wall   Subjective: No fevers, she thinks a little bit better but site still impressive. Tender still  ROS  Eleven systems are reviewed and negative except per hpi  Medications:  Antibiotics Given (last 72 hours)    Date/Time Action Medication Dose Rate   09/13/15 1901 Given   Ampicillin-Sulbactam (UNASYN) 3 g in sodium chloride 0.9 % 100 mL IVPB 3 g 100 mL/hr   09/14/15 0144 Given   Ampicillin-Sulbactam (UNASYN) 3 g in sodium chloride 0.9 % 100 mL IVPB 3 g 100 mL/hr   09/14/15 0555 Given   Ampicillin-Sulbactam (UNASYN) 3 g in sodium chloride 0.9 % 100 mL IVPB 3 g 100 mL/hr   09/14/15 1200 Given   Ampicillin-Sulbactam (UNASYN) 3 g in sodium chloride 0.9 % 100 mL IVPB 3 g 100 mL/hr   09/14/15 1700 Given   vancomycin (VANCOCIN) IVPB 1000 mg/200 mL premix 1,000 mg 200 mL/hr   09/14/15 1800 Given   Ampicillin-Sulbactam (UNASYN) 3 g in sodium chloride 0.9 % 100 mL IVPB 3 g 100 mL/hr   09/14/15 2350 Given   Ampicillin-Sulbactam (UNASYN) 3 g in sodium chloride 0.9 % 100 mL IVPB 3 g 100 mL/hr   09/15/15 0133 Given   vancomycin (VANCOCIN) IVPB 1000 mg/200 mL premix 1,000 mg 200 mL/hr   09/15/15 0517 Given   Ampicillin-Sulbactam (UNASYN) 3 g in sodium chloride 0.9 % 100 mL IVPB 3 g 100 mL/hr   09/15/15 0836 Given   vancomycin (VANCOCIN) IVPB 1000 mg/200 mL premix 1,000 mg 200 mL/hr   09/15/15 1204 Given   Ampicillin-Sulbactam (UNASYN) 3 g in sodium chloride 0.9 % 100 mL IVPB 3 g 100 mL/hr   09/15/15 1701 Given   vancomycin (VANCOCIN) IVPB 1000 mg/200 mL premix 1,000 mg 200 mL/hr   09/15/15 1838 Given   Ampicillin-Sulbactam (UNASYN) 3 g in sodium chloride 0.9 % 100 mL IVPB 3 g 100 mL/hr   09/15/15 2307 Given   Ampicillin-Sulbactam (UNASYN) 3 g in sodium chloride 0.9  % 100 mL IVPB 3 g 100 mL/hr   09/16/15 0029 Given   vancomycin (VANCOCIN) IVPB 1000 mg/200 mL premix 1,000 mg 200 mL/hr   09/16/15 0616 Given   Ampicillin-Sulbactam (UNASYN) 3 g in sodium chloride 0.9 % 100 mL IVPB 3 g 100 mL/hr   09/16/15 1150 Given   Ampicillin-Sulbactam (UNASYN) 3 g in sodium chloride 0.9 % 100 mL IVPB 3 g 100 mL/hr     . ampicillin-sulbactam (UNASYN) IV  3 g Intravenous Q6H  . diclofenac  1 patch Transdermal BID  . enoxaparin (LOVENOX) injection  40 mg Subcutaneous Q24H  . levothyroxine  88 mcg Oral QAC breakfast  . modafinil  200 mg Oral Daily  . nortriptyline  30 mg Oral QHS  . pregabalin  50 mg Oral TID  . sodium chloride flush  3 mL Intravenous Q12H  . vancomycin  1,000 mg Intravenous Q12H    Objective: Vital signs in last 24 hours: Temp:  [98 F (36.7 C)-98.3 F (36.8 C)] 98 F (36.7 C) (04/13 0429) Pulse Rate:  [72-96] 72 (04/13 0429) Resp:  [18] 18 (04/13 0429) BP: (117)/(67-75) 117/67 mmHg (04/13 0429) SpO2:  [95 %-96 %] 95 % (04/13 0429) Constitutional: oriented to person, place, and time. appears well-developed and well-nourished.  No distress. Obese, lying in bed, mildly ill appearing HENT: [place]/AT, PERRLA, no scleral icterus Mouth/Throat: Oropharynx is clear and moist. No oropharyngeal exudate.  Cardiovascular: Normal rate, regular rhythm and normal heart sounds.  Pulmonary/Chest: Effort normal and breath sounds normal. No respiratory distress. has no wheezes.  Neck supple, no nuchal rigidity Abdominal: obese, soft, R flank with football sized area of induration and redness, moderately tender to palpitation, surrounding erythema Lymphadenopathy: no cervical adenopathy. No axillary adenopathy Neurological: alert and oriented to person, place, and time.  Skin: cellulitis as above Psychiatric: a normal mood and affect. behavior is normal.   Lab Results  Recent Labs  09/14/15 0352 09/15/15 1025 09/16/15 0452  WBC  --   --  10.5  HGB   --   --  10.6*  HCT  --   --  30.5*  NA 133* 136  --   K 3.5 3.4* 3.5  CL 100* 102  --   CO2 23 27  --   BUN <5* 6  --   CREATININE 0.75 0.95 0.84    Microbiology: Results for orders placed or performed during the hospital encounter of 09/09/15  MRSA PCR Screening     Status: None   Collection Time: 09/09/15  2:11 PM  Result Value Ref Range Status   MRSA by PCR NEGATIVE NEGATIVE Final    Comment:        The GeneXpert MRSA Assay (FDA approved for NASAL specimens only), is one component of a comprehensive MRSA colonization surveillance program. It is not intended to diagnose MRSA infection nor to guide or monitor treatment for MRSA infections.     Studies/Results: [place] Abdomen Limited  09/14/2015  CLINICAL DATA:  Cellulitis of the abdominal wall/right flank, evaluate for drainable fluid collection or abscess EXAM: LIMITED ULTRASOUND OF ABDOMINAL SOFT TISSUES TECHNIQUE: Ultrasound examination of the abdominal wall soft tissues was performed in the area of clinical concern. COMPARISON:  CT abdomen/ pelvis dated 09/10/2015 FINDINGS: Target ultrasound performed in the area of clinical concern (right lateral abdominal wall/ flank). Subcutaneous edema is present. No drainable fluid collection/abscess is visualized. IMPRESSION: No drainable fluid collection/abscess is visualized. Electronically Signed   By: Charline Bills M.D.   On: 09/14/2015 16:50    Assessment/Plan: Shaela Boer is a 45 y.o. female with large R abd wall abscess, slowly responding to IV abx. Her MRSA PCR is neg but this does not rule out MRSA as cause. Her CT done several days ago did not show drainable abscess and USS done 4/11 does not show drainable fluid collection but the tissue area is tense and tender  Recommendations Cont vanco Cont unasyn Surgery feels equivocal about I and D and I do too.  It is actually a little less indurated today  Today we discussed possible dc tomorrow or Saturday.  I would  suggest using augmentin 875 bid  and doxycyline 100 bid for 14 more days at dc.  I told her if it worsens would need picc placed - this could be done as otpt next week and I told her to call my office if worsens.  Thank you very much for the consult. Will follow with you.  Elexus Barman P   09/16/2015, 4:23 PM

## 2015-09-16 NOTE — Progress Notes (Signed)
[facility] [place] [facility]   PATIENT NAME: Sandy Mitchell    MR#:  [id number]  DATE OF BIRTH:  06/14/1970  SUBJECTIVE:   No complaint.  REVIEW OF SYSTEMS:    Review of Systems  Constitutional: Negative for fever, chills and malaise/fatigue.  HENT: Negative for ear discharge, ear pain, hearing loss, nosebleeds and sore throat.   Eyes: Negative for blurred vision and pain.  Respiratory: Negative for cough, hemoptysis, shortness of breath and wheezing.   Cardiovascular: Negative for chest pain, palpitations and leg swelling.  Gastrointestinal: Negative for nausea, vomiting, abdominal pain, diarrhea and blood in stool.  Genitourinary: Negative for dysuria.  Musculoskeletal: Negative for back pain.  Skin:       Abdominal wall erythema is better. Neurological: Negative for dizziness, tremors, speech change, focal weakness, seizures and headaches.  Endo/Heme/Allergies: Does not bruise/bleed easily.  Psychiatric/Behavioral: Negative for depression, suicidal ideas and hallucinations.    Tolerating Diet:yes      DRUG ALLERGIES:   Allergies  Allergen Reactions  . Amlodipine Other (See Comments)    Reaction: extremities get swollen  . Melatonin Other (See Comments)    Reaction: Hallucinations   . Phenergan [Promethazine Hcl] Other (See Comments)    Reaction: Hallucinations  . Trazodone And Nefazodone Other (See Comments)    Reaction: Hallucination  . Atenolol Anxiety  . Nystatin Rash    VITALS:  Blood pressure 117/67, pulse 72, temperature 98 F (36.7 C), temperature source Oral, resp. rate 18, height 5\' 3"  (1.6 m), weight 88.451 kg (195 lb), last menstrual period 09/03/2015, SpO2 95 %.  PHYSICAL EXAMINATION:   Physical Exam  Constitutional: She is oriented to person, place, and time and well-developed, well-nourished, and in no distress. No distress.  HENT:  Head: Normocephalic.  Eyes: No scleral icterus.  Neck: Normal range of  motion. Neck supple. No JVD present. No tracheal deviation present.  Cardiovascular: Normal rate, regular rhythm and normal heart sounds.  Exam reveals no gallop and no friction rub.   No murmur heard. Pulmonary/Chest: Effort normal and breath sounds normal. No respiratory distress. She has no wheezes. She has no rales. She exhibits no tenderness.  Abdominal: Soft. Bowel sounds are normal. She exhibits no distension and no mass. There is no tenderness. There is no rebound and no guarding.  Musculoskeletal: Normal range of motion. Trace edema on bilateral upper and lower extremities.  Neurological: She is alert and oriented to person, place, and time.  Skin: Skin is warm. No rash noted. No erythema.  Large Abdominal wall cellulitis on the right side with erythema and warmness with tenderness to palpation. Erythema area is smaller. Psychiatric: Affect and judgment normal.      LABORATORY PANEL:   CBC  Recent Labs Lab 09/16/15 0452  WBC 10.5  HGB 10.6*  HCT 30.5*  PLT 335   ------------------------------------------------------------------------------------------------------------------  Chemistries   Recent Labs Lab 09/15/15 1025 09/16/15 0452  NA 136  --   K 3.4* 3.5  CL 102  --   CO2 27  --   GLUCOSE 154*  --   BUN 6  --   CREATININE 0.95 0.84  CALCIUM 8.3*  --   MG 1.9  --    ------------------------------------------------------------------------------------------------------------------  Cardiac Enzymes No results for input(s): TROPONINI in the last 168 hours. ------------------------------------------------------------------------------------------------------------------  RADIOLOGY:  [place] Abdomen Limited  09/14/2015  CLINICAL DATA:  Cellulitis of the abdominal wall/right flank, evaluate for drainable fluid collection or abscess EXAM: LIMITED ULTRASOUND OF ABDOMINAL SOFT  TISSUES TECHNIQUE: Ultrasound examination of the abdominal wall soft tissues was performed in  the area of clinical concern. COMPARISON:  CT abdomen/ pelvis dated 09/10/2015 FINDINGS: Target ultrasound performed in the area of clinical concern (right lateral abdominal wall/ flank). Subcutaneous edema is present. No drainable fluid collection/abscess is visualized. IMPRESSION: No drainable fluid collection/abscess is visualized. Electronically Signed   By: Charline Bills M.D.   On: 09/14/2015 16:50     ASSESSMENT AND PLAN:    45 y/o female with fibromyalgia here with sepsis and abdominal wall cellulitis.  1. Abdominal wall cellulitis extending to chest wall:  No abscess per CT scan of abdomen. No surgery at this time per surgeon. She was treated with ZOSYN and VANCOMYCIN, changed to unasyn. Abdominal wall US showed no abscess. Per Dr. Sampson Goon, continue unasyn and vancomycin. No surgery indication this time per Dr. Ma Hillock. Hold vancomycin due to high level at 31.  2. SEVERE Sepsis: This is from abdominal wall cellulitis. She presented with hypotension, tachycardia. Improved.  Continue abx as above.   Hypotension improved. discontinued IV fluids.  Lactic acidosis. Improved.  3. Acute renal failure: This is due to hypovolemia. improved with IV fluids.  4. Hyponatremia: This is due to hypovolemia/dehydration.  Improved with NS iv.  5. Hypothyroidism: Continue Synthroid.  6. Hypomagnesemia: Repleted and improved.  * Hypokalemia. Improved with KCl. *Hypomagnesemia. Improved with IV magnesium.  7. Fibromyalgia: Zanaflex was discontinued as this can cause hypotension. Continue all other medications.  Discussed with Dr. Ma Hillock. Management plans discussed with the patient and she is in agreement.  CODE STATUS: FULL  CARETOTAL TIME TAKING CARE OF THIS PATIENT: 33 minutes.    POSSIBLE D/C 2 days, DEPENDING ON CLINICAL CONDITION.   Shaune Pollack M.D on 09/16/2015 at 11:11 AM  Between 7am to 6pm - Pager - [phone] After 6pm go to [email or web address] - password [email or web address]  [facility]  [facility]  Office  [phone]  CC: Primary care physician; Default, Provider, MD  Note: This dictation was prepared with Dragon dictation along with smaller phrase technology. Any transcriptional errors that result from this process are unintentional.

## 2015-09-17 LAB — CREATININE, SERUM: Creatinine, Ser: 0.83 mg/dL (ref 0.44–1.00)

## 2015-09-17 LAB — POTASSIUM: Potassium: 3.7 mmol/L (ref 3.5–5.1)

## 2015-09-17 MED ORDER — AMOXICILLIN-POT CLAVULANATE 875-125 MG PO TABS: 1.0000 | ORAL_TABLET | Freq: Two times a day (BID) | ORAL | Status: DC

## 2015-09-17 MED ORDER — DOXYCYCLINE HYCLATE 50 MG PO CAPS: 50.0000 mg | ORAL_CAPSULE | Freq: Two times a day (BID) | ORAL | Status: DC

## 2015-09-17 NOTE — Discharge Summary (Signed)
[facility] - [place] at [facility]   PATIENT NAME: Sandy Mitchell    MR#:  [id number]  DATE OF BIRTH:  14-Jan-1971  DATE OF ADMISSION:  09/09/2015 ADMITTING PHYSICIAN: [name], MD  DATE OF DISCHARGE: 09/17/2015 12:03 PM  PRIMARY CARE PHYSICIAN: Default, Provider, MD    ADMISSION DIAGNOSIS:  Cellulitis of right abdominal wall [L03.311]   DISCHARGE DIAGNOSIS:  Abdominal wall cellulitis extending to chest wall SEVERE Sepsis SECONDARY DIAGNOSIS:   Past Medical History  Diagnosis Date  . Fibromyalgia   . Migraine   . Thyroid disease   . Migraine   . Chronic neck pain     HOSPITAL COURSE:   45 y/o female with fibromyalgia here with sepsis and abdominal wall cellulitis.  1. Abdominal wall cellulitis extending to chest wall:  No abscess per CT scan of abdomen. No surgery at this time per surgeon. She was treated with ZOSYN and VANCOMYCIN, changed to unasyn. Abdominal wall US showed no abscess. Per Dr. Sampson Goon, continue unasyn and vancomycin. No surgery indication this time per Dr. Ma Hillock. Hold vancomycin due to high level at 31.  Change to po augmentin and doxycycline for 14 days per Dr. Sampson Goon.  2. SEVERE Sepsis: This is from abdominal wall cellulitis. She presented with hypotension, tachycardia. Improved. Continue abx as above.   Hypotension improved. discontinued IV fluids.  Lactic acidosis. Improved.  3. Acute renal failure: This is due to hypovolemia. improved with IV fluids.  4. Hyponatremia: This is due to hypovolemia/dehydration. Improved with NS iv.  5. Hypothyroidism: Continue Synthroid.  6. Hypomagnesemia: Repleted and improved.  * Hypokalemia. Improved with KCl. *Hypomagnesemia. Improved with IV magnesium.  7. Fibromyalgia: Zanaflex was discontinued as this can cause hypotension. Continue all other medications.  Discussed with Dr. Ma Hillock.  DISCHARGE CONDITIONS:  Stable, discharged home  today.   CONSULTS OBTAINED:  Treatment Team:  Mick Sell, MD Gladis Riffle, MD  DRUG ALLERGIES:   Allergies  Allergen Reactions  . Amlodipine Other (See Comments)    Reaction: extremities get swollen  . Melatonin Other (See Comments)    Reaction: Hallucinations   . Phenergan [Promethazine Hcl] Other (See Comments)    Reaction: Hallucinations  . Trazodone And Nefazodone Other (See Comments)    Reaction: Hallucination  . Atenolol Anxiety  . Nystatin Rash    DISCHARGE MEDICATIONS:   Discharge Medication List as of 09/17/2015 11:10 AM    START taking these medications   Details  amoxicillin-clavulanate (AUGMENTIN) 875-125 MG tablet Take 1 tablet by mouth 2 (two) times daily., Starting 09/17/2015, Until Discontinued, Print    doxycycline (VIBRAMYCIN) 50 MG capsule Take 1 capsule (50 mg total) by mouth 2 (two) times daily., Starting 09/17/2015, Until Discontinued, Print      CONTINUE these medications which have NOT CHANGED   Details  butalbital-acetaminophen-caffeine (FIORICET, ESGIC) 50-325-40 MG tablet Take 1 tablet by mouth every 6 (six) hours as needed for headache., Until Discontinued, Historical Med    clonazePAM (KLONOPIN) 0.5 MG tablet Take 0.5 mg by mouth 2 (two) times daily as needed for anxiety., Until Discontinued, Historical Med    FLECTOR 1.3 % PTCH Place 1 patch onto the skin 2 (two) times daily. , Starting 06/09/2015, Until Discontinued, Historical Med    fluticasone (FLONASE) 50 MCG/ACT nasal spray Place 1 spray into both nostrils daily as needed for allergies or rhinitis. , Until Discontinued, Historical Med    levothyroxine (SYNTHROID, LEVOTHROID) 88 MCG tablet Take 88 mcg by mouth daily before breakfast.,  Until Discontinued, Historical Med    modafinil (PROVIGIL) 200 MG tablet Take 200 mg by mouth daily. , Starting 03/25/2015, Until Discontinued, Historical Med    nortriptyline (PAMELOR) 10 MG capsule Take 30 mg by mouth daily. , Until  Discontinued, Historical Med    ondansetron (ZOFRAN) 4 MG tablet Take 4 mg by mouth every 8 (eight) hours as needed for nausea or vomiting., Until Discontinued, Historical Med    pregabalin (LYRICA) 50 MG capsule Take 50 mg by mouth 3 (three) times daily., Until Discontinued, Historical Med    Prenatal Multivit-Min-Fe-FA (PRENATAL VITAMINS PO) Take 1 tablet by mouth daily., Until Discontinued, Historical Med    tiZANidine (ZANAFLEX) 2 MG tablet Take 2-4 mg by mouth daily as needed for muscle spasms. , Starting 08/04/2015, Until Discontinued, Historical Med    traMADol (ULTRAM) 50 MG tablet Take 1 tablet (50 mg total) by mouth every 6 (six) hours as needed., Starting 09/07/2015, Until Discontinued, Print      STOP taking these medications     sulfamethoxazole-trimethoprim (BACTRIM DS,SEPTRA DS) 800-160 MG tablet          DISCHARGE INSTRUCTIONS:    If you experience worsening of your admission symptoms, develop shortness of breath, life threatening emergency, suicidal or homicidal thoughts you must seek medical attention immediately by calling 911 or calling your MD immediately  if symptoms less severe.  You Must read complete instructions/literature along with all the possible adverse reactions/side effects for all the Medicines you take and that have been prescribed to you. Take any new Medicines after you have completely understood and accept all the possible adverse reactions/side effects.   Please note  You were cared for by a hospitalist during your hospital stay. If you have any questions about your discharge medications or the care you received while you were in the hospital after you are discharged, you can call the unit and asked to speak with the hospitalist on call if the hospitalist that took care of you is not available. Once you are discharged, your primary care physician will handle any further medical issues. Please note that NO REFILLS for any discharge medications will be  authorized once you are discharged, as it is imperative that you return to your primary care physician (or establish a relationship with a primary care physician if you do not have one) for your aftercare needs so that they can reassess your need for medications and monitor your lab values.    Today   SUBJECTIVE   No complaint.   VITAL SIGNS:  Blood pressure 114/61, pulse 82, temperature 98.2 F (36.8 C), temperature source Oral, resp. rate 16, height  (1.6 m), weight 88.451 kg (195 lb), last menstrual period 09/03/2015, SpO2 95 %.  I/O:   Intake/Output Summary (Last 24 hours) at 09/17/15 1603 Last data filed at 09/17/15 1028  Gross per 24 hour  Intake   1200 ml  Output   3150 ml  Net  -1950 ml    PHYSICAL EXAMINATION:  GENERAL:  45 y.o.-year-old patient lying in the bed with no acute distress. Obese.  EYES: Pupils equal, round, reactive to light and accommodation. No scleral icterus. Extraocular muscles intact.  HEENT: Head atraumatic, normocephalic. Oropharynx and nasopharynx clear.  NECK:  Supple, no jugular venous distention. No thyroid enlargement, no tenderness.  LUNGS: Normal breath sounds bilaterally, no wheezing, rales,rhonchi or crepitation. No use of accessory muscles of respiration.  CARDIOVASCULAR: S1, S2 normal. No murmurs, rubs, or gallops.  ABDOMEN: Soft, non-tender,  non-distended. Bowel sounds present. No organomegaly or mass.  Abdominal wall cellulitis on the right side with erythema and warmness and mild tenderness to palpation. Erythema area is much smaller. EXTREMITIES: No pedal edema, cyanosis, or clubbing.  NEUROLOGIC: Cranial nerves II through XII are intact. Muscle strength 5/5 in all extremities. Sensation intact. Gait not checked.  PSYCHIATRIC: The patient is alert and oriented x 3.  SKIN: No obvious rash, lesion, or ulcer.   DATA REVIEW:   CBC  Recent Labs Lab 09/16/15 0452  WBC 10.5  HGB 10.6*  HCT 30.5*  PLT 335    Chemistries    Recent Labs Lab 09/15/15 1025  09/17/15 0340  NA 136  --   --   K 3.4*  < > 3.7  CL 102  --   --   CO2 27  --   --   GLUCOSE 154*  --   --   BUN 6  --   --   CREATININE 0.95  < > 0.83  CALCIUM 8.3*  --   --   MG 1.9  --   --   < > = values in this interval not displayed.  Cardiac Enzymes No results for input(s): TROPONINI in the last 168 hours.  Microbiology Results  Results for orders placed or performed during the hospital encounter of 09/09/15  MRSA PCR Screening     Status: None   Collection Time: 09/09/15  2:11 PM  Result Value Ref Range Status   MRSA by PCR NEGATIVE NEGATIVE Final    Comment:        The GeneXpert MRSA Assay (FDA approved for NASAL specimens only), is one component of a comprehensive MRSA colonization surveillance program. It is not intended to diagnose MRSA infection nor to guide or monitor treatment for MRSA infections.     RADIOLOGY:  No results found.      Management plans discussed with the patient, family and they are in agreement.  CODE STATUS:  Code Status History    Date Active Date Inactive Code Status Order ID Comments User Context   09/09/2015 10:55 AM 09/17/2015  3:03 PM Full Code [id number]  Alford Highlandichard Wieting, MD ED      TOTAL TIME TAKING CARE OF THIS PATIENT: 32 minutes.    Shaune Pollackhen, Ayansh Feutz M.D on 09/17/2015 at 4:03 PM  Between 7am to 6pm - Pager - [phone]  After 6pm go to [email or web address] - password [email or web address] [facility]  [facility]  Office  [phone]  CC: Primary care physician; Default, Provider, MD

## 2015-09-17 NOTE — Progress Notes (Signed)
Pt stable. IV removed. D/c instructions given and education provided. Prescriptions verified and given. Pt states she understands instructions. Pt dressed and escorted out by staff. Driven home by family.  

## 2015-09-17 NOTE — Discharge Instructions (Signed)
Heart healthy diet. °Activity as tolerated. °

## 2015-09-20 NOTE — Progress Notes (Signed)
[facility] - [place] [facility]        Sandy PaymentKelly Thueson was admitted to the Hospital on 09/09/2015 and Discharged  09/20/2015 and should be excused from work/school,  starting 09/09/2015 , until she get recommendation from PCP or Dr. Sampson GoonFitzgerald.  Shaune PollackQing Rashawn Rolon MD.  Shaune Pollackhen, Reida Hem M.D on 09/20/2015,at 4:15 PM  [place]  [facility] [place] [facility]    Office  [phone]

## 2015-09-22 DIAGNOSIS — Z792 Long term (current) use of antibiotics: Secondary | ICD-10-CM | POA: Diagnosis not present

## 2015-09-22 DIAGNOSIS — L02211 Cutaneous abscess of abdominal wall: Secondary | ICD-10-CM | POA: Diagnosis not present

## 2015-09-23 DIAGNOSIS — M542 Cervicalgia: Secondary | ICD-10-CM | POA: Diagnosis not present

## 2015-09-23 DIAGNOSIS — Z6832 Body mass index (BMI) 32.0-32.9, adult: Secondary | ICD-10-CM | POA: Diagnosis not present

## 2015-09-23 DIAGNOSIS — L03311 Cellulitis of abdominal wall: Secondary | ICD-10-CM | POA: Diagnosis not present

## 2015-09-23 DIAGNOSIS — A419 Sepsis, unspecified organism: Secondary | ICD-10-CM | POA: Diagnosis not present

## 2015-10-21 DIAGNOSIS — R51 Headache: Secondary | ICD-10-CM | POA: Diagnosis not present

## 2015-10-21 DIAGNOSIS — R200 Anesthesia of skin: Secondary | ICD-10-CM | POA: Diagnosis not present

## 2015-10-21 DIAGNOSIS — M542 Cervicalgia: Secondary | ICD-10-CM | POA: Diagnosis not present

## 2015-10-21 DIAGNOSIS — M797 Fibromyalgia: Secondary | ICD-10-CM | POA: Diagnosis not present

## 2015-10-27 DIAGNOSIS — E039 Hypothyroidism, unspecified: Secondary | ICD-10-CM | POA: Diagnosis not present

## 2015-10-27 DIAGNOSIS — L02211 Cutaneous abscess of abdominal wall: Secondary | ICD-10-CM | POA: Diagnosis not present

## 2015-11-02 DIAGNOSIS — Z6832 Body mass index (BMI) 32.0-32.9, adult: Secondary | ICD-10-CM | POA: Diagnosis not present

## 2015-11-02 DIAGNOSIS — B373 Candidiasis of vulva and vagina: Secondary | ICD-10-CM | POA: Diagnosis not present

## 2015-11-10 DIAGNOSIS — L02211 Cutaneous abscess of abdominal wall: Secondary | ICD-10-CM | POA: Diagnosis not present

## 2015-11-17 DIAGNOSIS — G4726 Circadian rhythm sleep disorder, shift work type: Secondary | ICD-10-CM | POA: Diagnosis not present

## 2015-11-17 DIAGNOSIS — G4719 Other hypersomnia: Secondary | ICD-10-CM | POA: Diagnosis not present

## 2015-11-17 DIAGNOSIS — Z6832 Body mass index (BMI) 32.0-32.9, adult: Secondary | ICD-10-CM | POA: Diagnosis not present

## 2015-11-17 DIAGNOSIS — G4733 Obstructive sleep apnea (adult) (pediatric): Secondary | ICD-10-CM | POA: Diagnosis not present

## 2016-03-22 DIAGNOSIS — Z1231 Encounter for screening mammogram for malignant neoplasm of breast: Secondary | ICD-10-CM | POA: Diagnosis not present

## 2016-03-23 DIAGNOSIS — R51 Headache: Secondary | ICD-10-CM | POA: Diagnosis not present

## 2016-03-23 DIAGNOSIS — M542 Cervicalgia: Secondary | ICD-10-CM | POA: Diagnosis not present

## 2016-03-23 DIAGNOSIS — G8929 Other chronic pain: Secondary | ICD-10-CM | POA: Diagnosis not present

## 2016-03-23 DIAGNOSIS — M797 Fibromyalgia: Secondary | ICD-10-CM | POA: Diagnosis not present

## 2016-03-28 DIAGNOSIS — H5213 Myopia, bilateral: Secondary | ICD-10-CM | POA: Diagnosis not present

## 2016-03-29 DIAGNOSIS — H16223 Keratoconjunctivitis sicca, not specified as Sjogren's, bilateral: Secondary | ICD-10-CM | POA: Diagnosis not present

## 2016-03-29 DIAGNOSIS — H04123 Dry eye syndrome of bilateral lacrimal glands: Secondary | ICD-10-CM | POA: Diagnosis not present

## 2016-03-29 DIAGNOSIS — Z0000 Encounter for general adult medical examination without abnormal findings: Secondary | ICD-10-CM | POA: Diagnosis not present

## 2016-03-29 DIAGNOSIS — H0289 Other specified disorders of eyelid: Secondary | ICD-10-CM | POA: Diagnosis not present

## 2016-03-29 DIAGNOSIS — Z6832 Body mass index (BMI) 32.0-32.9, adult: Secondary | ICD-10-CM | POA: Diagnosis not present

## 2016-04-05 DIAGNOSIS — R928 Other abnormal and inconclusive findings on diagnostic imaging of breast: Secondary | ICD-10-CM | POA: Diagnosis not present

## 2016-04-06 DIAGNOSIS — Z124 Encounter for screening for malignant neoplasm of cervix: Secondary | ICD-10-CM | POA: Diagnosis not present

## 2016-04-06 DIAGNOSIS — R87612 Low grade squamous intraepithelial lesion on cytologic smear of cervix (LGSIL): Secondary | ICD-10-CM | POA: Diagnosis not present

## 2016-04-06 DIAGNOSIS — N921 Excessive and frequent menstruation with irregular cycle: Secondary | ICD-10-CM | POA: Diagnosis not present

## 2016-04-20 DIAGNOSIS — R300 Dysuria: Secondary | ICD-10-CM | POA: Diagnosis not present

## 2016-04-20 DIAGNOSIS — Z6833 Body mass index (BMI) 33.0-33.9, adult: Secondary | ICD-10-CM | POA: Diagnosis not present

## 2016-04-20 DIAGNOSIS — B373 Candidiasis of vulva and vagina: Secondary | ICD-10-CM | POA: Diagnosis not present

## 2016-05-24 ENCOUNTER — Encounter: Payer: Self-pay | Admitting: Physician Assistant

## 2016-05-24 ENCOUNTER — Ambulatory Visit: Payer: Self-pay | Admitting: Physician Assistant

## 2016-05-24 VITALS — BP 102/74 | HR 88 | Temp 98.3°F

## 2016-05-24 DIAGNOSIS — H00025 Hordeolum internum left lower eyelid: Secondary | ICD-10-CM

## 2016-05-24 MED ORDER — SULFAMETHOXAZOLE-TRIMETHOPRIM 800-160 MG PO TABS: 1.0000 | ORAL_TABLET | Freq: Two times a day (BID) | ORAL | 0 refills | Status: DC

## 2016-05-24 MED ORDER — TOBRAMYCIN 0.3 % OP SOLN: 2.0000 [drp] | OPHTHALMIC | 0 refills | Status: DC

## 2016-05-24 NOTE — Progress Notes (Signed)
S: c/o ?sty in left lower lid, states its inside the eye, painful, area has gotten sore, did have some matting this am as she thinks pus came out of sty, hx of dry eye and gland dysfunction , uses smooth eyes for lubrication, no fever or chills, no cp/sob  O: vitals wnl nad, perr eomi, conjunctiva wnl, left lower lid with sty, small whitehead noted on area, no drainage, neck supple no lymph, n/v intact  A: sty  P: tobramycin, septra

## 2016-06-05 DIAGNOSIS — M5020 Other cervical disc displacement, unspecified cervical region: Secondary | ICD-10-CM | POA: Insufficient documentation

## 2016-06-12 DIAGNOSIS — Z6833 Body mass index (BMI) 33.0-33.9, adult: Secondary | ICD-10-CM | POA: Diagnosis not present

## 2016-06-12 DIAGNOSIS — H00015 Hordeolum externum left lower eyelid: Secondary | ICD-10-CM | POA: Diagnosis not present

## 2016-06-12 DIAGNOSIS — R0602 Shortness of breath: Secondary | ICD-10-CM | POA: Diagnosis not present

## 2016-06-16 DIAGNOSIS — R87810 Cervical high risk human papillomavirus (HPV) DNA test positive: Secondary | ICD-10-CM | POA: Diagnosis not present

## 2016-06-16 DIAGNOSIS — N870 Mild cervical dysplasia: Secondary | ICD-10-CM | POA: Diagnosis not present

## 2016-06-16 DIAGNOSIS — N72 Inflammatory disease of cervix uteri: Secondary | ICD-10-CM | POA: Diagnosis not present

## 2016-06-16 DIAGNOSIS — B977 Papillomavirus as the cause of diseases classified elsewhere: Secondary | ICD-10-CM | POA: Diagnosis not present

## 2016-06-29 DIAGNOSIS — M542 Cervicalgia: Secondary | ICD-10-CM | POA: Diagnosis not present

## 2016-06-29 DIAGNOSIS — M797 Fibromyalgia: Secondary | ICD-10-CM | POA: Diagnosis not present

## 2016-06-29 DIAGNOSIS — R200 Anesthesia of skin: Secondary | ICD-10-CM | POA: Diagnosis not present

## 2016-06-29 DIAGNOSIS — R51 Headache: Secondary | ICD-10-CM | POA: Diagnosis not present

## 2016-07-04 DIAGNOSIS — M5412 Radiculopathy, cervical region: Secondary | ICD-10-CM | POA: Diagnosis not present

## 2016-07-13 DIAGNOSIS — M5412 Radiculopathy, cervical region: Secondary | ICD-10-CM | POA: Diagnosis not present

## 2016-07-18 DIAGNOSIS — M5412 Radiculopathy, cervical region: Secondary | ICD-10-CM | POA: Diagnosis not present

## 2016-07-18 DIAGNOSIS — M5020 Other cervical disc displacement, unspecified cervical region: Secondary | ICD-10-CM | POA: Diagnosis not present

## 2016-07-18 DIAGNOSIS — M50123 Cervical disc disorder at C6-C7 level with radiculopathy: Secondary | ICD-10-CM | POA: Diagnosis not present

## 2016-07-27 DIAGNOSIS — M5412 Radiculopathy, cervical region: Secondary | ICD-10-CM | POA: Diagnosis not present

## 2016-07-27 DIAGNOSIS — R87618 Other abnormal cytological findings on specimens from cervix uteri: Secondary | ICD-10-CM | POA: Diagnosis not present

## 2016-07-27 DIAGNOSIS — E039 Hypothyroidism, unspecified: Secondary | ICD-10-CM | POA: Diagnosis not present

## 2016-07-27 DIAGNOSIS — Z0000 Encounter for general adult medical examination without abnormal findings: Secondary | ICD-10-CM | POA: Diagnosis not present

## 2016-07-27 DIAGNOSIS — M5030 Other cervical disc degeneration, unspecified cervical region: Secondary | ICD-10-CM | POA: Diagnosis not present

## 2016-08-10 DIAGNOSIS — M542 Cervicalgia: Secondary | ICD-10-CM | POA: Diagnosis not present

## 2016-08-20 IMAGING — CT CT ABD-PELV W/ CM
1 of 3 series · 14 of 32 positions shown, 19 images · IV contrast (iopamidol)
Comparison: None.

CLINICAL DATA: Right-sided abdominal pain

EXAM:
CT ABDOMEN AND PELVIS WITH CONTRAST
TECHNIQUE: Multidetector CT imaging of the abdomen and pelvis was performed
using the standard protocol following bolus administration of
intravenous contrast.
CONTRAST:  100mL UPQ9CT-XLL IOPAMIDOL (UPQ9CT-XLL) INJECTION 61%

[Series 2: routine abd pel with · axial · 0.93mm/px · z∈[-508,-62]mm · 14 of 101 slices shown, 19 images]
[im 6/101  soft-tissue]
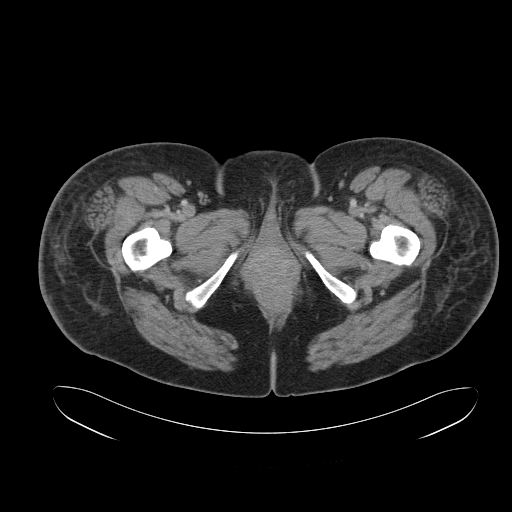
[im 6/101  bone]
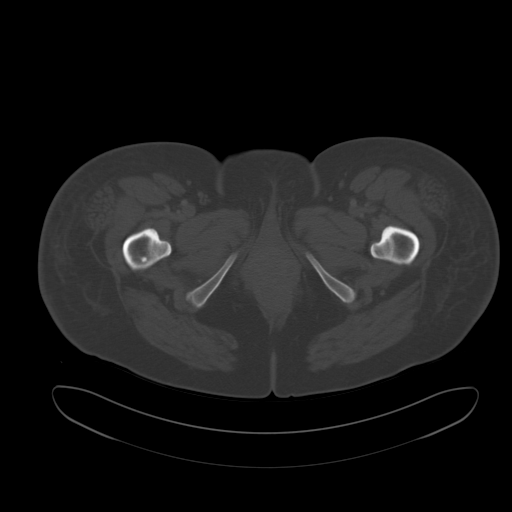
[im 16/101  soft-tissue]
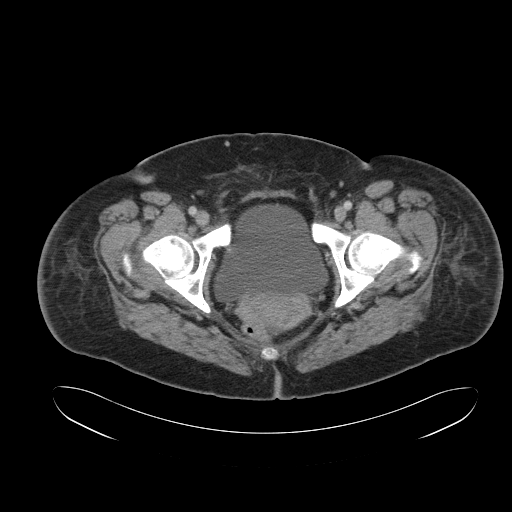
[im 22/101  soft-tissue]
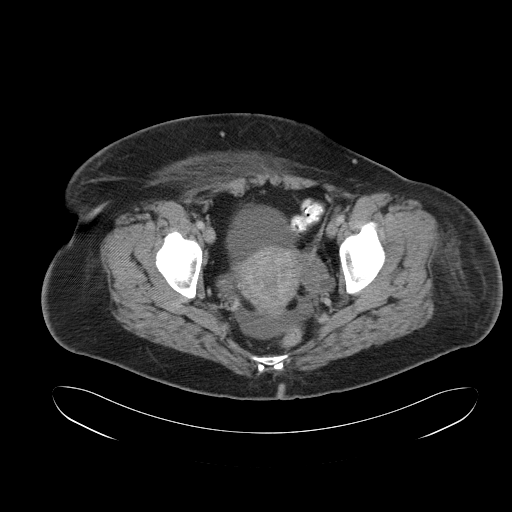
[im 27/101  soft-tissue]
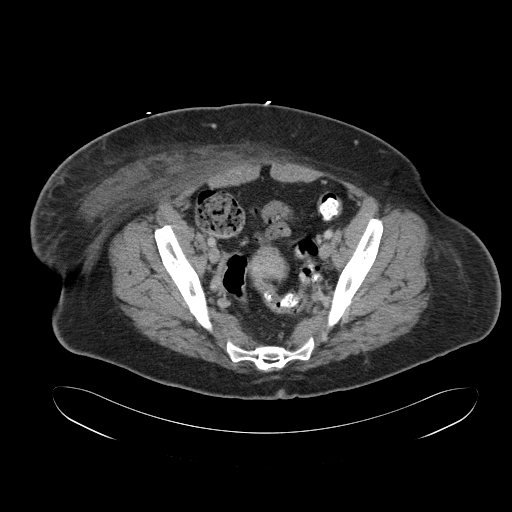
[im 37/101  soft-tissue]
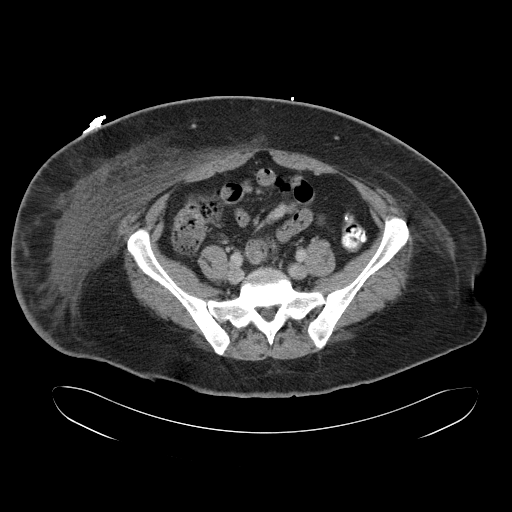
[im 43/101  soft-tissue]
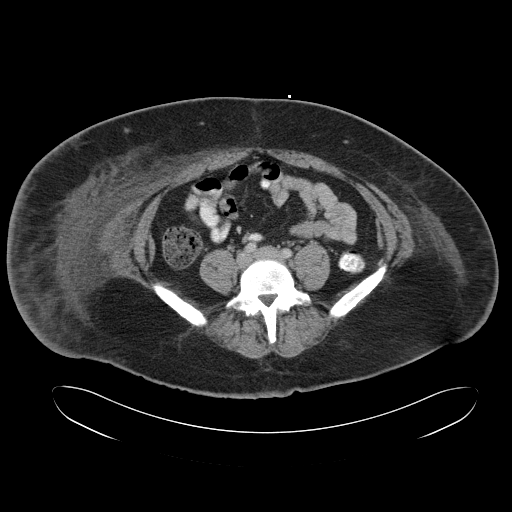
[im 53/101  soft-tissue]
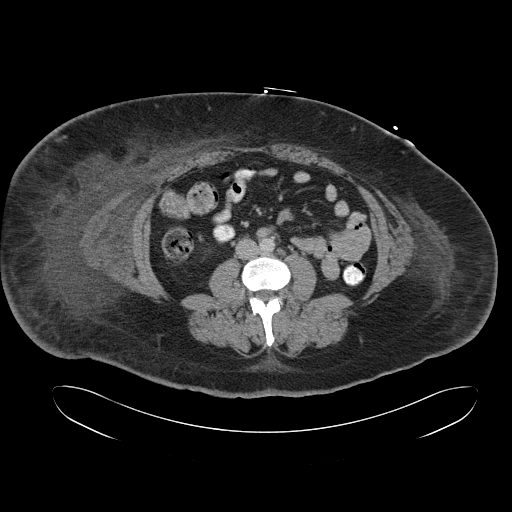
[im 58/101  soft-tissue]
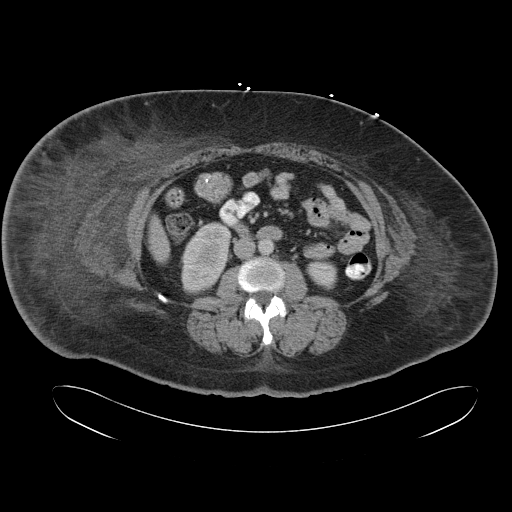
[im 64/101  soft-tissue]
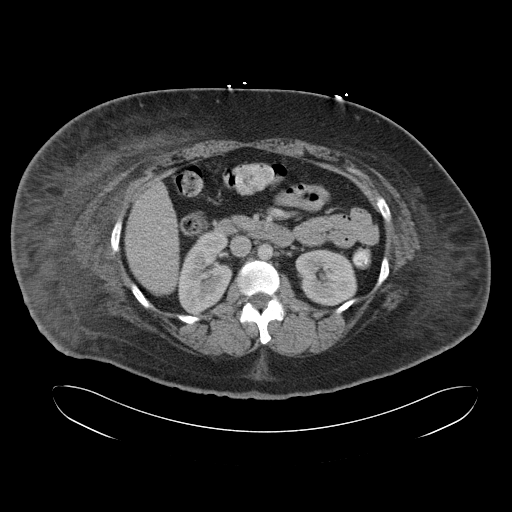
[im 64/101  bone]
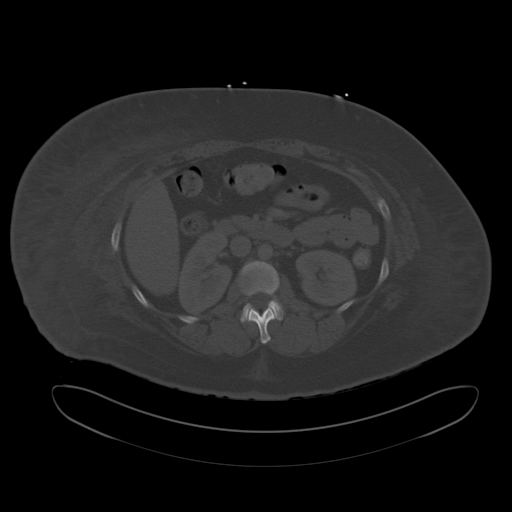
[im 74/101  soft-tissue]
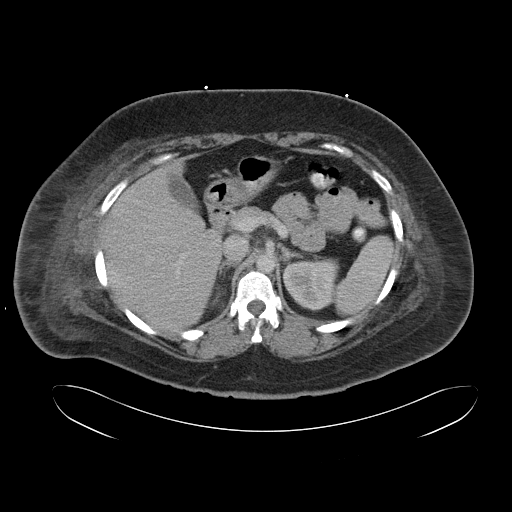
[im 79/101  soft-tissue]
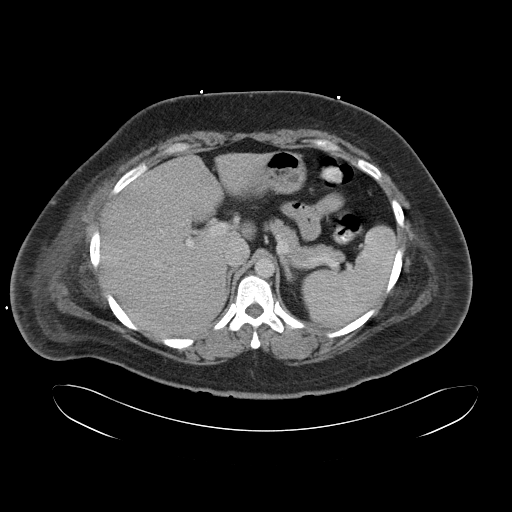
[im 79/101  lung]
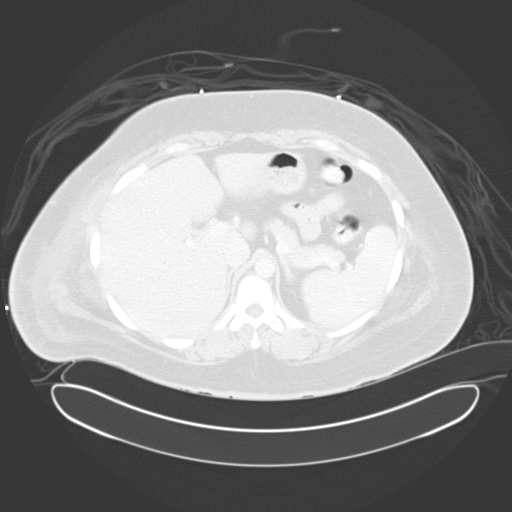
[im 85/101  soft-tissue]
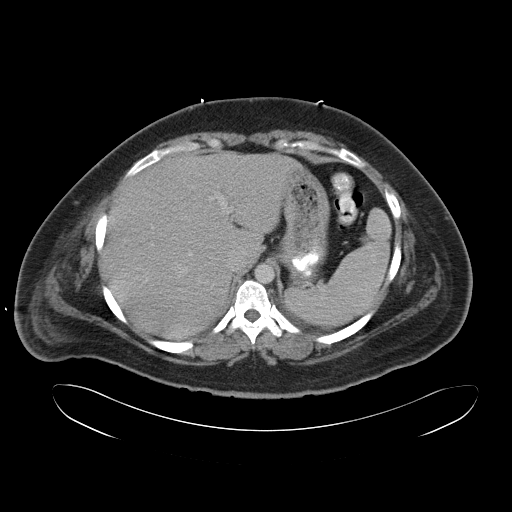
[im 85/101  lung]
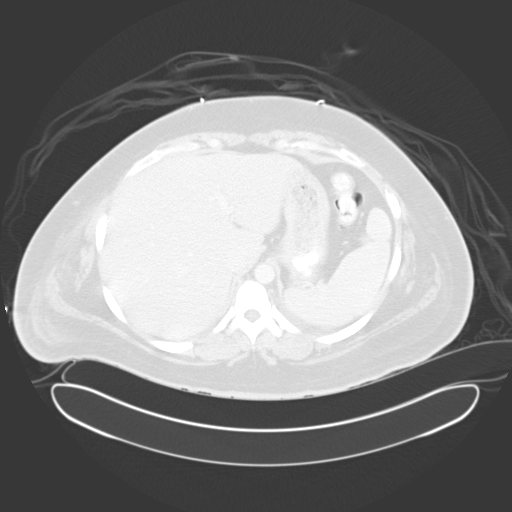
[im 90/101  lung]
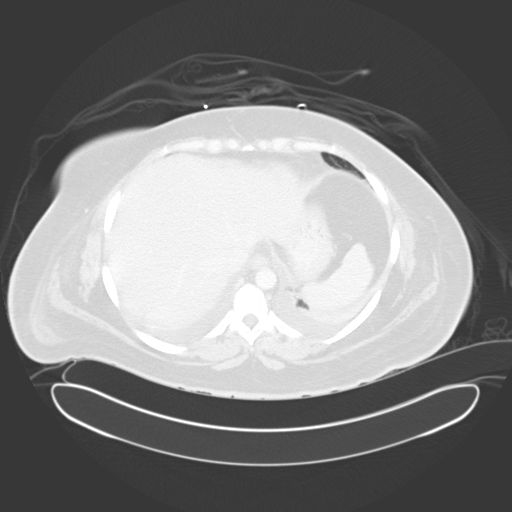
[im 95/101  soft-tissue]
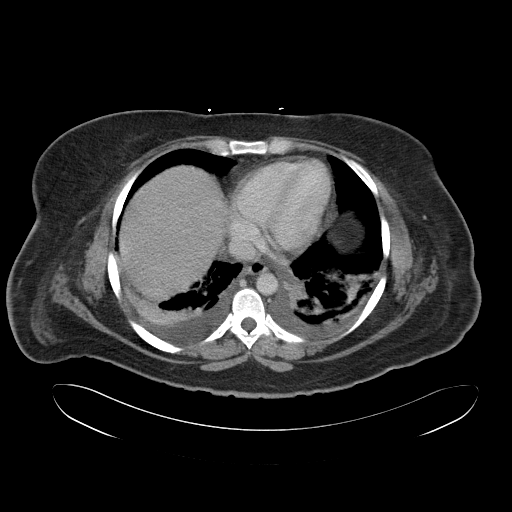
[im 95/101  lung]
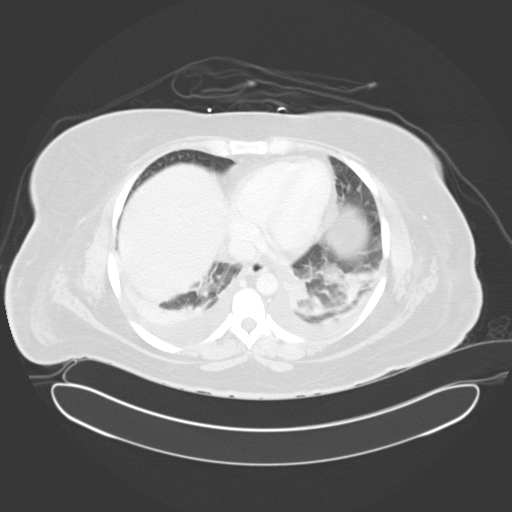

[14 of 32 positions shown; findings below may reference images not displayed]

FINDINGS: Lung bases demonstrate small bilateral pleural effusions and
bibasilar infiltrate.

The liver, gallbladder, spleen, adrenal glands and pancreas are
within normal limits. The kidneys are well visualized and reveal a
normal enhancement pattern. No abnormal mass or abnormal
calcifications are seen. No obstructive changes are noted.

The appendix is well visualized and within normal limits. No
significant diverticular change of the colon is noted. The uterus
and ovaries are unremarkable. Minimal free fluid is noted within the
pelvis this may be physiologic in nature. The bladder is well
distended. No bony abnormality is seen.

Significant subcutaneous edema and skin thickening is noted in the
abdominal wall particularly on the right. No focal abscess is
identified.
IMPRESSION: Changes consistent with cellulitis and edema in the abdominal wall
particularly on the right. No focal abscess is seen.

No other acute abnormality within the abdomen and pelvis is seen.

## 2016-08-31 DIAGNOSIS — M4802 Spinal stenosis, cervical region: Secondary | ICD-10-CM | POA: Diagnosis not present

## 2016-09-04 DIAGNOSIS — N760 Acute vaginitis: Secondary | ICD-10-CM | POA: Diagnosis not present

## 2016-09-04 DIAGNOSIS — M4802 Spinal stenosis, cervical region: Secondary | ICD-10-CM | POA: Diagnosis not present

## 2016-09-18 DIAGNOSIS — M5412 Radiculopathy, cervical region: Secondary | ICD-10-CM | POA: Diagnosis not present

## 2016-09-24 DIAGNOSIS — M79602 Pain in left arm: Secondary | ICD-10-CM | POA: Diagnosis not present

## 2016-09-24 DIAGNOSIS — M4802 Spinal stenosis, cervical region: Secondary | ICD-10-CM | POA: Diagnosis not present

## 2016-09-24 DIAGNOSIS — M5020 Other cervical disc displacement, unspecified cervical region: Secondary | ICD-10-CM | POA: Diagnosis not present

## 2016-09-25 DIAGNOSIS — M5412 Radiculopathy, cervical region: Secondary | ICD-10-CM | POA: Diagnosis not present

## 2016-10-02 DIAGNOSIS — M5412 Radiculopathy, cervical region: Secondary | ICD-10-CM | POA: Diagnosis not present

## 2016-11-01 DIAGNOSIS — Z8614 Personal history of Methicillin resistant Staphylococcus aureus infection: Secondary | ICD-10-CM | POA: Diagnosis not present

## 2016-11-01 DIAGNOSIS — Z112 Encounter for screening for other bacterial diseases: Secondary | ICD-10-CM | POA: Diagnosis not present

## 2016-11-01 DIAGNOSIS — Z01818 Encounter for other preprocedural examination: Secondary | ICD-10-CM | POA: Diagnosis not present

## 2016-11-01 DIAGNOSIS — Z79899 Other long term (current) drug therapy: Secondary | ICD-10-CM | POA: Diagnosis not present

## 2016-11-01 DIAGNOSIS — M5412 Radiculopathy, cervical region: Secondary | ICD-10-CM | POA: Diagnosis not present

## 2016-11-01 DIAGNOSIS — Z136 Encounter for screening for cardiovascular disorders: Secondary | ICD-10-CM | POA: Diagnosis not present

## 2016-11-11 DIAGNOSIS — M4802 Spinal stenosis, cervical region: Secondary | ICD-10-CM | POA: Diagnosis not present

## 2016-11-11 DIAGNOSIS — M5020 Other cervical disc displacement, unspecified cervical region: Secondary | ICD-10-CM | POA: Diagnosis not present

## 2016-11-16 DIAGNOSIS — M5412 Radiculopathy, cervical region: Secondary | ICD-10-CM | POA: Diagnosis not present

## 2016-11-17 DIAGNOSIS — Z9889 Other specified postprocedural states: Secondary | ICD-10-CM | POA: Diagnosis not present

## 2016-11-17 DIAGNOSIS — M50123 Cervical disc disorder at C6-C7 level with radiculopathy: Secondary | ICD-10-CM | POA: Diagnosis not present

## 2017-01-04 DIAGNOSIS — Z981 Arthrodesis status: Secondary | ICD-10-CM | POA: Diagnosis not present

## 2017-01-04 DIAGNOSIS — Z4789 Encounter for other orthopedic aftercare: Secondary | ICD-10-CM | POA: Diagnosis not present

## 2017-01-04 DIAGNOSIS — Z9889 Other specified postprocedural states: Secondary | ICD-10-CM | POA: Diagnosis not present

## 2017-02-01 DIAGNOSIS — G479 Sleep disorder, unspecified: Secondary | ICD-10-CM | POA: Insufficient documentation

## 2017-02-01 DIAGNOSIS — M797 Fibromyalgia: Secondary | ICD-10-CM | POA: Diagnosis not present

## 2017-02-01 DIAGNOSIS — Z87898 Personal history of other specified conditions: Secondary | ICD-10-CM | POA: Insufficient documentation

## 2017-02-21 DIAGNOSIS — L239 Allergic contact dermatitis, unspecified cause: Secondary | ICD-10-CM | POA: Diagnosis not present

## 2017-02-21 DIAGNOSIS — M542 Cervicalgia: Secondary | ICD-10-CM | POA: Diagnosis not present

## 2017-02-21 DIAGNOSIS — R079 Chest pain, unspecified: Secondary | ICD-10-CM | POA: Diagnosis not present

## 2017-02-21 DIAGNOSIS — M79651 Pain in right thigh: Secondary | ICD-10-CM | POA: Diagnosis not present

## 2017-04-05 DIAGNOSIS — Z1231 Encounter for screening mammogram for malignant neoplasm of breast: Secondary | ICD-10-CM | POA: Diagnosis not present

## 2017-04-09 ENCOUNTER — Encounter: Payer: Self-pay | Admitting: Physician Assistant

## 2017-05-31 DIAGNOSIS — R51 Headache: Secondary | ICD-10-CM | POA: Diagnosis not present

## 2017-05-31 DIAGNOSIS — G479 Sleep disorder, unspecified: Secondary | ICD-10-CM | POA: Diagnosis not present

## 2017-05-31 DIAGNOSIS — M797 Fibromyalgia: Secondary | ICD-10-CM | POA: Diagnosis not present

## 2017-06-11 DIAGNOSIS — Z6829 Body mass index (BMI) 29.0-29.9, adult: Secondary | ICD-10-CM | POA: Diagnosis not present

## 2017-06-11 DIAGNOSIS — G43009 Migraine without aura, not intractable, without status migrainosus: Secondary | ICD-10-CM | POA: Diagnosis not present

## 2017-06-11 DIAGNOSIS — R87619 Unspecified abnormal cytological findings in specimens from cervix uteri: Secondary | ICD-10-CM | POA: Insufficient documentation

## 2017-06-11 DIAGNOSIS — Z8371 Family history of colonic polyps: Secondary | ICD-10-CM | POA: Diagnosis not present

## 2017-06-11 DIAGNOSIS — R87610 Atypical squamous cells of undetermined significance on cytologic smear of cervix (ASC-US): Secondary | ICD-10-CM | POA: Diagnosis not present

## 2017-06-11 DIAGNOSIS — F329 Major depressive disorder, single episode, unspecified: Secondary | ICD-10-CM | POA: Diagnosis not present

## 2017-06-11 DIAGNOSIS — J069 Acute upper respiratory infection, unspecified: Secondary | ICD-10-CM | POA: Diagnosis not present

## 2017-06-11 DIAGNOSIS — Z0000 Encounter for general adult medical examination without abnormal findings: Secondary | ICD-10-CM | POA: Diagnosis not present

## 2017-06-11 DIAGNOSIS — E039 Hypothyroidism, unspecified: Secondary | ICD-10-CM | POA: Diagnosis not present

## 2017-06-15 DIAGNOSIS — Z0000 Encounter for general adult medical examination without abnormal findings: Secondary | ICD-10-CM | POA: Diagnosis not present

## 2017-06-15 DIAGNOSIS — E039 Hypothyroidism, unspecified: Secondary | ICD-10-CM | POA: Diagnosis not present

## 2017-08-09 ENCOUNTER — Encounter: Payer: Self-pay | Admitting: *Deleted

## 2017-08-09 ENCOUNTER — Ambulatory Visit
Admission: EM | Admit: 2017-08-09 | Discharge: 2017-08-09 | Discharge status: Against Medical Advice | Payer: 59 | Attending: Family Medicine | Admitting: Family Medicine

## 2017-08-09 DIAGNOSIS — R05 Cough: Secondary | ICD-10-CM

## 2017-08-09 DIAGNOSIS — R059 Cough, unspecified: Secondary | ICD-10-CM

## 2017-08-09 NOTE — ED Triage Notes (Signed)
Productive cough- yellow, nasal discharge- yellow, facial pain, headache, x1 week.

## 2017-08-10 DIAGNOSIS — Z6829 Body mass index (BMI) 29.0-29.9, adult: Secondary | ICD-10-CM | POA: Diagnosis not present

## 2017-08-10 DIAGNOSIS — J0100 Acute maxillary sinusitis, unspecified: Secondary | ICD-10-CM | POA: Diagnosis not present

## 2017-08-11 NOTE — ED Provider Notes (Signed)
Not seeing. Left without being seen.  Everlene OtherJayce Inocencio Roy DO [facility]    Tommie SamsCook, Tasheika Kitzmiller G, [place]DO 08/11/17 [phone]

## 2017-08-16 DIAGNOSIS — M542 Cervicalgia: Secondary | ICD-10-CM | POA: Diagnosis not present

## 2017-08-21 DIAGNOSIS — M542 Cervicalgia: Secondary | ICD-10-CM | POA: Diagnosis not present

## 2017-08-28 DIAGNOSIS — M542 Cervicalgia: Secondary | ICD-10-CM | POA: Diagnosis not present

## 2017-09-04 DIAGNOSIS — M542 Cervicalgia: Secondary | ICD-10-CM | POA: Diagnosis not present

## 2017-09-06 ENCOUNTER — Encounter: Payer: Self-pay | Admitting: Obstetrics and Gynecology

## 2017-09-06 ENCOUNTER — Ambulatory Visit (INDEPENDENT_AMBULATORY_CARE_PROVIDER_SITE_OTHER): Payer: 59 | Admitting: Obstetrics and Gynecology

## 2017-09-06 VITALS — BP 118/74 | Ht 63.0 in | Wt 170.0 lb

## 2017-09-06 DIAGNOSIS — R87610 Atypical squamous cells of undetermined significance on cytologic smear of cervix (ASC-US): Secondary | ICD-10-CM

## 2017-09-06 DIAGNOSIS — R87810 Cervical high risk human papillomavirus (HPV) DNA test positive: Secondary | ICD-10-CM | POA: Diagnosis not present

## 2017-09-06 DIAGNOSIS — N870 Mild cervical dysplasia: Secondary | ICD-10-CM | POA: Diagnosis not present

## 2017-09-06 NOTE — Progress Notes (Signed)
HPI:  Deborha PaymentKelly Hoselton is a 47 y.o.  [phone]  who presents today for evaluation and management of abnormal cervical cytology.    Dysplasia History:  04/2016: NILM, HPV 16 positive 06/2016 - colposcopy CIN 1 06/11/17: ASCUS-HPV positive  OB History  Gravida Para Term Preterm AB Living  3 3 2 1   3   SAB TAB Ectopic Multiple Live Births          3    # Outcome Date GA Lbr Len/2nd Weight Sex Delivery Anes PTL Lv  3 Preterm           2 Term           1 Term             Past Medical History:  Diagnosis Date  . Abnormal Pap smear of cervix   . Anxiety   . Chronic neck pain   . Depression   . Fibromyalgia   . Fibromyalgia   . GERD (gastroesophageal reflux disease)   . Hypothyroid   . Migraine   . Migraine   . Migraine   . Sleep disorder   . Thyroid disease     Past Surgical History:  Procedure Laterality Date  . TUBAL LIGATION      SOCIAL HISTORY:  Social History   Substance and Sexual Activity  Alcohol Use No  . Alcohol/week: 0.0 oz    Social History   Substance and Sexual Activity  Drug Use No     Family History  Problem Relation Age of Onset  . Healthy Mother   . Kidney failure Father   . Hypertension Father   . Diabetes Father   . Colon cancer Father   . Lung cancer Maternal Grandfather   . Lung cancer Paternal Grandfather     ALLERGIES:  Amlodipine; Melatonin; Phenergan [promethazine hcl]; Trazodone and nefazodone; Atenolol; and Nystatin  Current Outpatient Medications on File Prior to Visit  Medication Sig Dispense Refill  . butalbital-acetaminophen-caffeine (FIORICET, ESGIC) 50-325-40 MG tablet Take 1 tablet by mouth every 6 (six) hours as needed for headache.    Marland Kitchen. FLECTOR 1.3 % PTCH Place 1 patch onto the skin 2 (two) times daily.   0  . fluticasone (FLONASE) 50 MCG/ACT nasal spray Place 1 spray into both nostrils daily as needed for allergies or rhinitis.     Marland Kitchen. gabapentin (NEURONTIN) 100 MG capsule Take 1-3 pills daily of this for  fibromyalgia    . levothyroxine (SYNTHROID, LEVOTHROID) 88 MCG tablet Take 88 mcg by mouth daily before breakfast.    . modafinil (PROVIGIL) 200 MG tablet Take 200 mg by mouth daily.   0  . ondansetron (ZOFRAN) 4 MG tablet Take 4 mg by mouth every 8 (eight) hours as needed for nausea or vomiting.    . ranitidine (ZANTAC) 150 MG tablet Take 150 mg by mouth.    . rizatriptan (MAXALT) 10 MG tablet Take 1 tab at headache onset may repeat once in two hours if needed    . sulfamethoxazole-trimethoprim (BACTRIM DS,SEPTRA DS) 800-160 MG tablet Take 1 tablet by mouth 2 (two) times daily. 14 tablet 0  . tobramycin (TOBREX) 0.3 % ophthalmic solution Place 2 drops into the left eye every 4 (four) hours. 5 mL 0  . topiramate (TOPAMAX) 50 MG tablet 1 tab nightly for 1 week, then increase 1 tab twice a day for 1 week then increase  1 tabam, 2tabPM for a week then increase to 2tabs bid  No current facility-administered medications on file prior to visit.     Physical Exam: -Vitals:  BP 118/74   Ht 5\' 3"  (1.6 m)   Wt 170 lb (77.1 kg)   LMP 08/24/2017   BMI 30.11 kg/m  GEN: WD, WN, NAD.  A+ O x 3, good mood and affect. ABD:  NT, ND.  Soft, no masses.  No hernias noted.   Pelvic:   Vulva: Normal appearance.  No lesions.  Vagina: No lesions or abnormalities noted.  Support: Normal pelvic support.  Urethra No masses tenderness or scarring.  Meatus Normal size without lesions or prolapse.  Cervix: See below.  Anus: Normal exam.  No lesions.  Perineum: Normal exam.  No lesions.        Bimanual   Uterus: Normal size.  Non-tender.  Mobile.  AV.  Adnexae: No masses.  Non-tender to palpation.  Cul-de-sac: Negative for abnormality.   PROCEDURE: 1.  Urine Pregnancy Test:  not done 2.  Colposcopy performed with 4% acetic acid after verbal consent obtained                                         -Aceto-white Lesions Location(s): diffusely               -Biopsy performed at 4, 7, and 12 o'clock                -ECC indicated and performed: Yes.       -Biopsy sites made hemostatic with pressure, AgNO3, and/or Monsel's solution   -Satisfactory colposcopy: No.    -Evidence of Invasive cervical CA :  NO  ASSESSMENT:  Tracina Beaumont is a 47 y.o. [phone] here for  1. ASCUS with positive high risk HPV cervical   .  PLAN:  I discussed the grading system of pap smears and HPV high risk viral types.  We will discuss and base management after colpo results return.      Thomasene Mohair, MD  [facility], [facility] 09/06/2017  9:22 AM

## 2017-09-07 DIAGNOSIS — H04123 Dry eye syndrome of bilateral lacrimal glands: Secondary | ICD-10-CM | POA: Diagnosis not present

## 2017-09-10 LAB — PATHOLOGY

## 2017-09-11 ENCOUNTER — Telehealth: Payer: Self-pay | Admitting: Obstetrics and Gynecology

## 2017-09-11 DIAGNOSIS — M452 Ankylosing spondylitis of cervical region: Secondary | ICD-10-CM | POA: Diagnosis not present

## 2017-09-11 NOTE — Telephone Encounter (Signed)
Left generic VM 

## 2017-09-12 ENCOUNTER — Encounter: Payer: Self-pay | Admitting: Obstetrics and Gynecology

## 2017-09-12 NOTE — Telephone Encounter (Signed)
Left another generic VM. Will leave message through MyChart.

## 2017-09-12 NOTE — Telephone Encounter (Signed)
Pt sent MyChart message to SDJ after she missed his call. fwding back to him.

## 2017-09-17 ENCOUNTER — Encounter: Payer: Self-pay | Admitting: Obstetrics and Gynecology

## 2017-09-18 DIAGNOSIS — M542 Cervicalgia: Secondary | ICD-10-CM | POA: Diagnosis not present

## 2017-09-21 DIAGNOSIS — Z7689 Persons encountering health services in other specified circumstances: Secondary | ICD-10-CM | POA: Diagnosis not present

## 2017-09-21 DIAGNOSIS — K219 Gastro-esophageal reflux disease without esophagitis: Secondary | ICD-10-CM | POA: Diagnosis not present

## 2017-09-21 DIAGNOSIS — E7800 Pure hypercholesterolemia, unspecified: Secondary | ICD-10-CM | POA: Diagnosis not present

## 2017-09-21 DIAGNOSIS — F329 Major depressive disorder, single episode, unspecified: Secondary | ICD-10-CM | POA: Diagnosis not present

## 2017-09-21 DIAGNOSIS — F419 Anxiety disorder, unspecified: Secondary | ICD-10-CM | POA: Diagnosis not present

## 2017-09-21 DIAGNOSIS — M3500 Sicca syndrome, unspecified: Secondary | ICD-10-CM | POA: Diagnosis not present

## 2017-09-26 DIAGNOSIS — M542 Cervicalgia: Secondary | ICD-10-CM | POA: Diagnosis not present

## 2017-09-27 DIAGNOSIS — M797 Fibromyalgia: Secondary | ICD-10-CM | POA: Diagnosis not present

## 2017-09-27 DIAGNOSIS — R51 Headache: Secondary | ICD-10-CM | POA: Diagnosis not present

## 2017-09-27 DIAGNOSIS — G479 Sleep disorder, unspecified: Secondary | ICD-10-CM | POA: Diagnosis not present

## 2017-10-05 DIAGNOSIS — H04123 Dry eye syndrome of bilateral lacrimal glands: Secondary | ICD-10-CM | POA: Diagnosis not present

## 2017-10-06 IMAGING — US US ABDOMEN LIMITED
1 series · 14 of 25 positions shown · non-contrast
Comparison: CT abdomen/ pelvis dated 09/10/2015

CLINICAL DATA: Cellulitis of the abdominal wall/right flank,
evaluate for drainable fluid collection or abscess

EXAM:
LIMITED ULTRASOUND OF ABDOMINAL SOFT TISSUES
TECHNIQUE: Ultrasound examination of the abdominal wall soft tissues was
performed in the area of clinical concern.

[Series 1: us abdomen limited · 0.10mm/px · 14 of 25 slices shown]
[im 1/25]
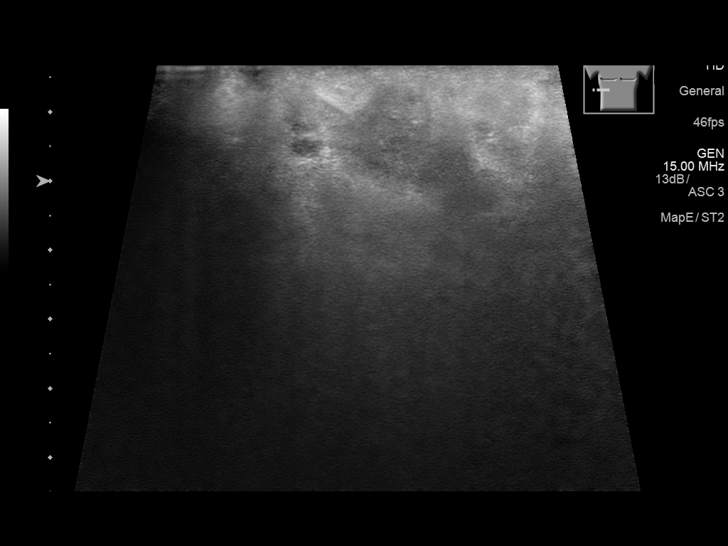
[im 3/25]
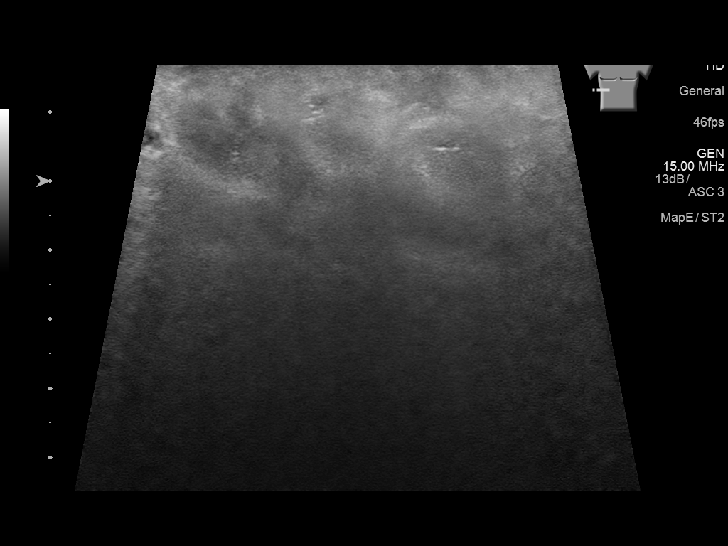
[im 5/25]
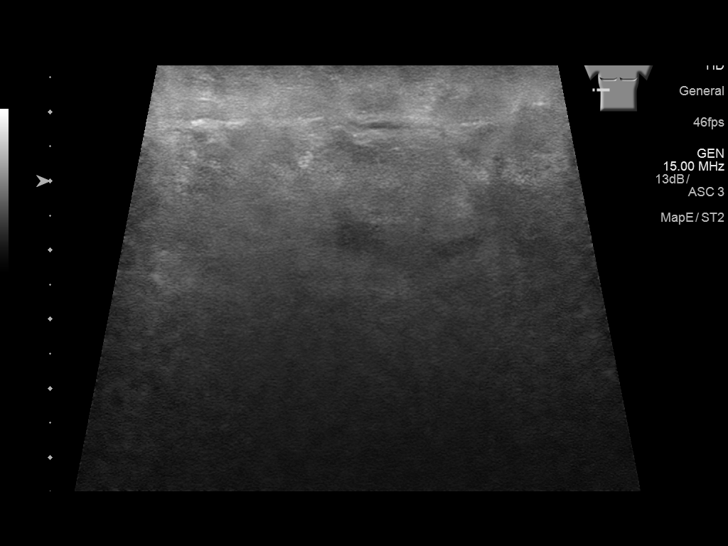
[im 7/25]
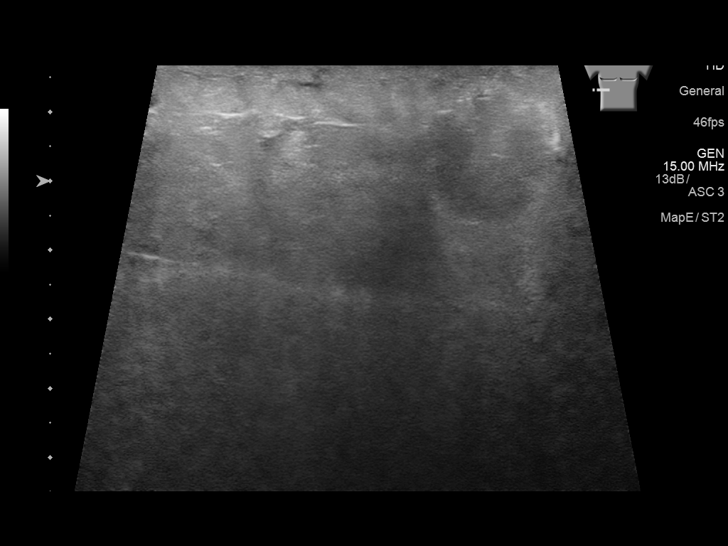
[im 9/25]
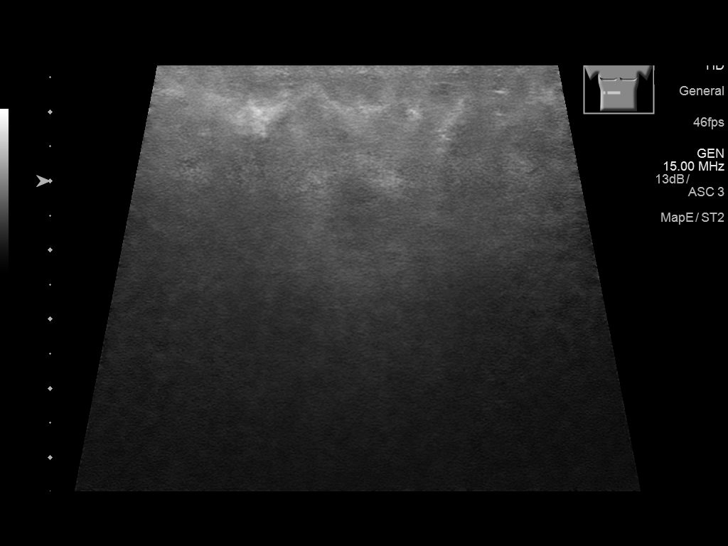
[im 10/25]
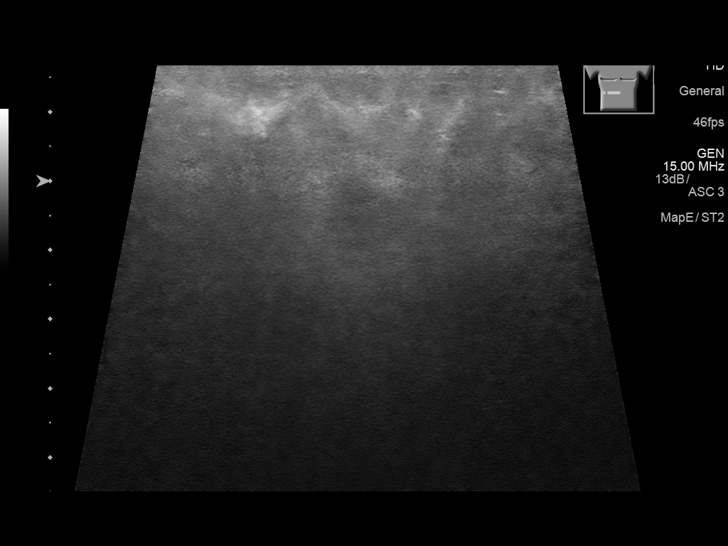
[im 12/25]
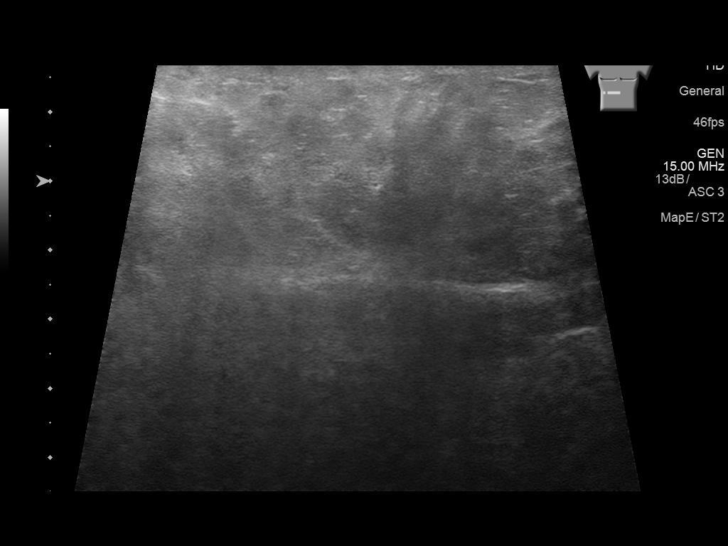
[im 14/25]
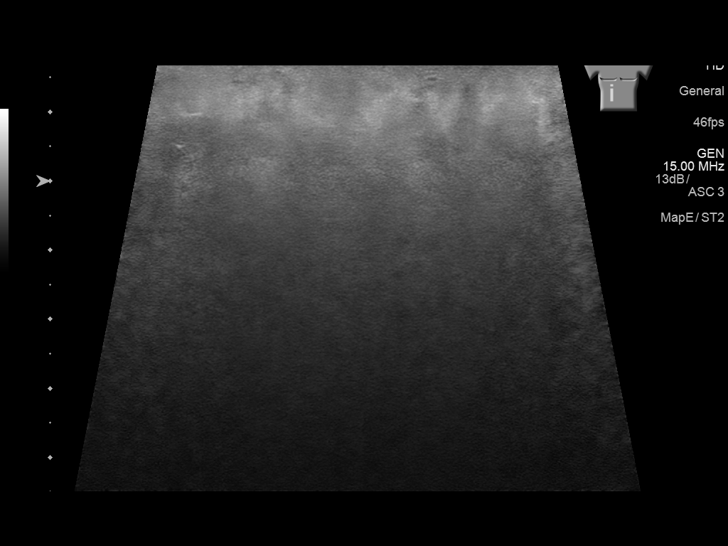
[im 16/25]
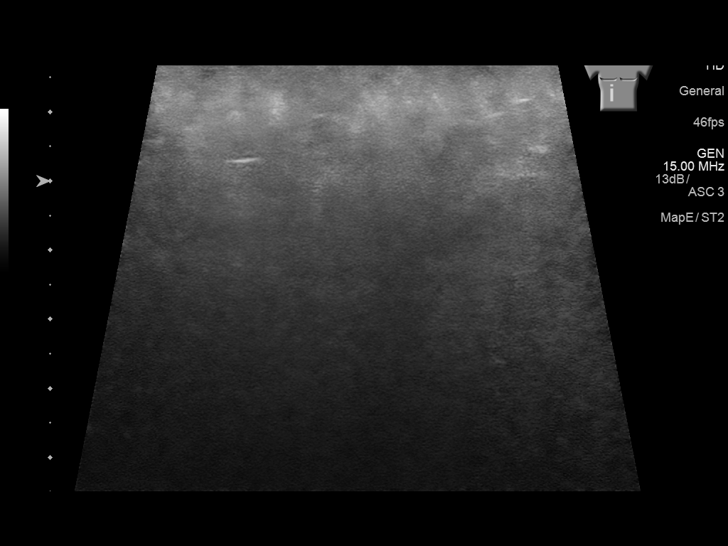
[im 17/25]
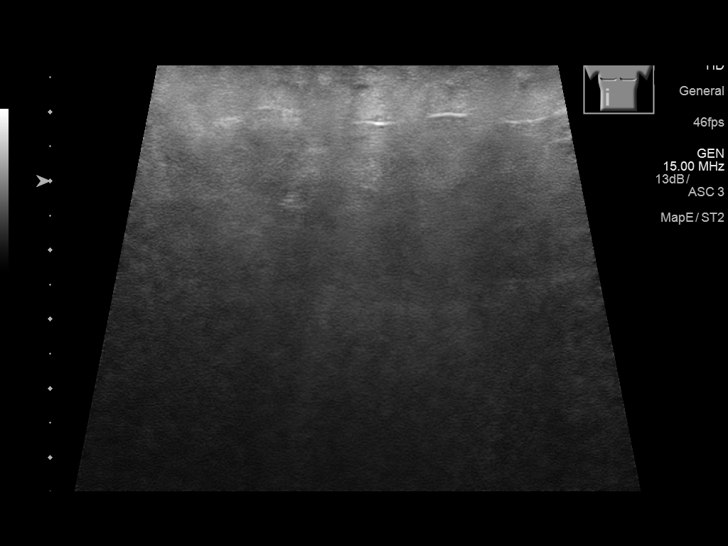
[im 19/25]
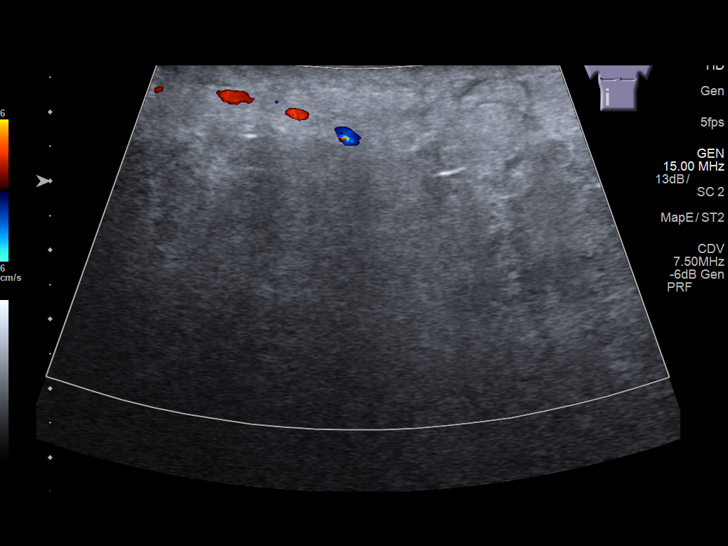
[im 21/25]
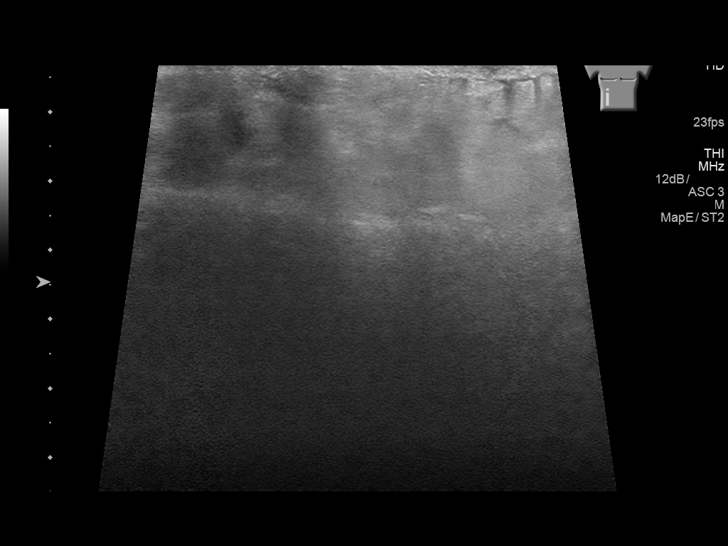
[im 23/25]
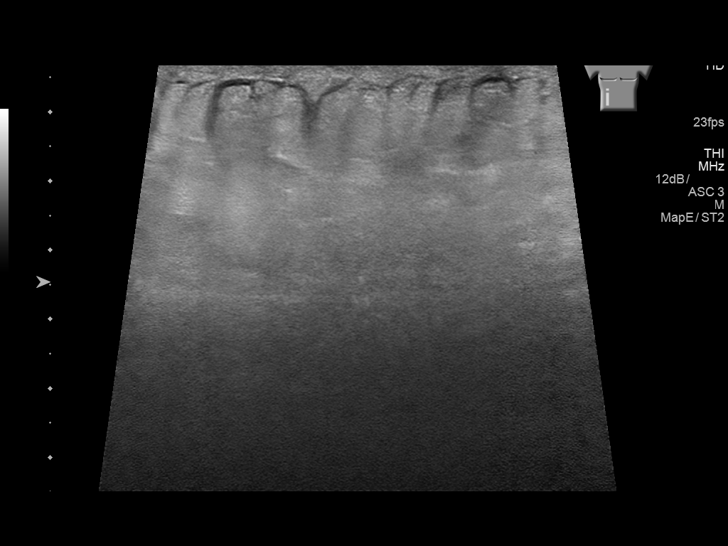
[im 25/25]
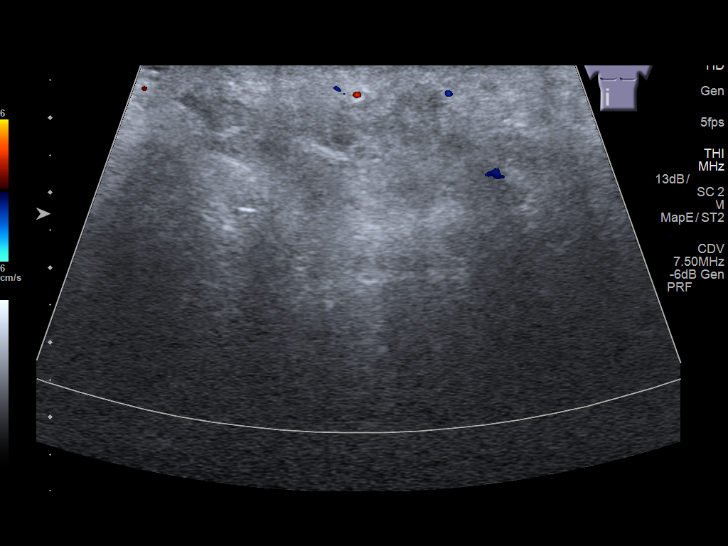

[14 of 25 positions shown; findings below may reference images not displayed]

FINDINGS: Target ultrasound performed in the area of clinical concern (right
lateral abdominal wall/ flank).

Subcutaneous edema is present.

No drainable fluid collection/abscess is visualized.
IMPRESSION: No drainable fluid collection/abscess is visualized.

## 2017-10-10 DIAGNOSIS — H04129 Dry eye syndrome of unspecified lacrimal gland: Secondary | ICD-10-CM | POA: Diagnosis not present

## 2017-10-10 DIAGNOSIS — E669 Obesity, unspecified: Secondary | ICD-10-CM | POA: Diagnosis not present

## 2017-10-10 DIAGNOSIS — M542 Cervicalgia: Secondary | ICD-10-CM | POA: Diagnosis not present

## 2017-10-10 DIAGNOSIS — E039 Hypothyroidism, unspecified: Secondary | ICD-10-CM | POA: Diagnosis not present

## 2017-10-10 DIAGNOSIS — R87619 Unspecified abnormal cytological findings in specimens from cervix uteri: Secondary | ICD-10-CM | POA: Diagnosis not present

## 2017-10-16 DIAGNOSIS — M542 Cervicalgia: Secondary | ICD-10-CM | POA: Diagnosis not present

## 2017-10-24 DIAGNOSIS — R768 Other specified abnormal immunological findings in serum: Secondary | ICD-10-CM | POA: Diagnosis not present

## 2017-10-24 DIAGNOSIS — M542 Cervicalgia: Secondary | ICD-10-CM | POA: Diagnosis not present

## 2017-10-24 DIAGNOSIS — H04123 Dry eye syndrome of bilateral lacrimal glands: Secondary | ICD-10-CM | POA: Insufficient documentation

## 2017-10-24 DIAGNOSIS — R682 Dry mouth, unspecified: Secondary | ICD-10-CM | POA: Insufficient documentation

## 2017-10-27 DIAGNOSIS — M542 Cervicalgia: Secondary | ICD-10-CM | POA: Diagnosis not present

## 2017-10-29 DIAGNOSIS — M542 Cervicalgia: Secondary | ICD-10-CM | POA: Diagnosis not present

## 2017-11-07 DIAGNOSIS — M542 Cervicalgia: Secondary | ICD-10-CM | POA: Diagnosis not present

## 2017-11-21 DIAGNOSIS — M542 Cervicalgia: Secondary | ICD-10-CM | POA: Diagnosis not present

## 2017-12-03 DIAGNOSIS — R682 Dry mouth, unspecified: Secondary | ICD-10-CM | POA: Diagnosis not present

## 2017-12-03 DIAGNOSIS — H04123 Dry eye syndrome of bilateral lacrimal glands: Secondary | ICD-10-CM | POA: Diagnosis not present

## 2017-12-05 DIAGNOSIS — M3501 Sicca syndrome with keratoconjunctivitis: Secondary | ICD-10-CM | POA: Insufficient documentation

## 2017-12-06 DIAGNOSIS — M542 Cervicalgia: Secondary | ICD-10-CM | POA: Diagnosis not present

## 2017-12-20 DIAGNOSIS — M542 Cervicalgia: Secondary | ICD-10-CM | POA: Diagnosis not present

## 2017-12-25 DIAGNOSIS — M542 Cervicalgia: Secondary | ICD-10-CM | POA: Diagnosis not present

## 2018-01-07 DIAGNOSIS — M542 Cervicalgia: Secondary | ICD-10-CM | POA: Diagnosis not present

## 2018-01-16 DIAGNOSIS — M542 Cervicalgia: Secondary | ICD-10-CM | POA: Diagnosis not present

## 2018-01-17 DIAGNOSIS — R51 Headache: Secondary | ICD-10-CM | POA: Diagnosis not present

## 2018-01-17 DIAGNOSIS — G479 Sleep disorder, unspecified: Secondary | ICD-10-CM | POA: Diagnosis not present

## 2018-01-17 DIAGNOSIS — M797 Fibromyalgia: Secondary | ICD-10-CM | POA: Diagnosis not present

## 2018-01-18 DIAGNOSIS — R5382 Chronic fatigue, unspecified: Secondary | ICD-10-CM | POA: Diagnosis not present

## 2018-01-18 DIAGNOSIS — G479 Sleep disorder, unspecified: Secondary | ICD-10-CM | POA: Diagnosis not present

## 2018-01-18 DIAGNOSIS — J069 Acute upper respiratory infection, unspecified: Secondary | ICD-10-CM | POA: Diagnosis not present

## 2018-01-18 DIAGNOSIS — Z6829 Body mass index (BMI) 29.0-29.9, adult: Secondary | ICD-10-CM | POA: Diagnosis not present

## 2018-02-07 ENCOUNTER — Telehealth: Payer: 59 | Admitting: Physician Assistant

## 2018-02-07 DIAGNOSIS — H01009 Unspecified blepharitis unspecified eye, unspecified eyelid: Secondary | ICD-10-CM | POA: Diagnosis not present

## 2018-02-07 MED ORDER — NEOMYCIN-POLYMYXIN-HC 3.5-10000-1 OP SUSP: 3.0000 [drp] | Freq: Four times a day (QID) | OPHTHALMIC | 0 refills | Status: DC

## 2018-02-07 NOTE — Progress Notes (Signed)
We are sorry that you are not feeling well. Here is how we plan to help!  Based on what you have shared with me it looks like you have a stye.  A stye is an inflammation of the eyelid.  It is often a red, painful lump near the edge of the eyelid that may look like a boil or a pimple.  A stye develops when an infection occurs at the base of an eyelash.   We have made appropriate suggestions for you based upon your presentation: Your symptoms may indicate an infection of the sclera.  The use of anti-inflammatory and antibiotic eye drops for a week will help resolve this condition.  I have sent in neomycin-polymyxin HC opthalmic suspension, two to three drops in the affected eye every 4 hours.  If your symptoms do not improve over the next two to three days you should be seen in your doctor's office.  HOME CARE:   Wash your hands often!  Let the stye open on its own. Don't squeeze or open it.  Don't rub your eyes. This can irritate your eyes and let in bacteria.  If you need to touch your eyes, wash your hands first.  Don't wear eye makeup or contact lenses until the area has healed.  GET HELP RIGHT AWAY IF:   Your symptoms do not improve.  You develop blurred or loss of vision.  Your symptoms worsen (increased discharge, pain or redness).  Thank you for choosing an e-visit.  Your e-visit answers were reviewed by a board certified advanced clinical practitioner to complete your personal care plan.  Depending upon the condition, your plan could have included both over the counter or prescription medications.  Please review your pharmacy choice.  Make sure the pharmacy is open so you can pick up prescription now.  If there is a problem, you may contact your provider through MyChart messaging and have the prescription routed to another pharmacy.    Your safety is important to us.  If you have drug allergies check your prescription carefully.  For the next 24 hours you can use MyChart to  ask questions about today's visit, request a non-urgent call back, or ask for a work or school excuse.  You will get an email in the next two days asking about your experience.  I hope you that your e-visit has been valuable and will speed your recovery.      

## 2018-02-12 DIAGNOSIS — M542 Cervicalgia: Secondary | ICD-10-CM | POA: Diagnosis not present

## 2018-02-20 DIAGNOSIS — M19012 Primary osteoarthritis, left shoulder: Secondary | ICD-10-CM | POA: Diagnosis not present

## 2018-02-20 DIAGNOSIS — M25559 Pain in unspecified hip: Secondary | ICD-10-CM | POA: Diagnosis not present

## 2018-02-20 DIAGNOSIS — M5136 Other intervertebral disc degeneration, lumbar region: Secondary | ICD-10-CM | POA: Diagnosis not present

## 2018-02-20 DIAGNOSIS — M545 Low back pain: Secondary | ICD-10-CM | POA: Diagnosis not present

## 2018-02-20 DIAGNOSIS — M542 Cervicalgia: Secondary | ICD-10-CM | POA: Diagnosis not present

## 2018-02-20 DIAGNOSIS — G8929 Other chronic pain: Secondary | ICD-10-CM | POA: Diagnosis not present

## 2018-02-20 DIAGNOSIS — M25519 Pain in unspecified shoulder: Secondary | ICD-10-CM | POA: Diagnosis not present

## 2018-02-22 DIAGNOSIS — M542 Cervicalgia: Secondary | ICD-10-CM | POA: Diagnosis not present

## 2018-03-12 DIAGNOSIS — M542 Cervicalgia: Secondary | ICD-10-CM | POA: Diagnosis not present

## 2018-03-26 DIAGNOSIS — M542 Cervicalgia: Secondary | ICD-10-CM | POA: Diagnosis not present

## 2018-05-10 DIAGNOSIS — Z6831 Body mass index (BMI) 31.0-31.9, adult: Secondary | ICD-10-CM | POA: Diagnosis not present

## 2018-05-10 DIAGNOSIS — G2581 Restless legs syndrome: Secondary | ICD-10-CM | POA: Diagnosis not present

## 2018-05-10 DIAGNOSIS — K219 Gastro-esophageal reflux disease without esophagitis: Secondary | ICD-10-CM | POA: Diagnosis not present

## 2018-05-10 DIAGNOSIS — R87619 Unspecified abnormal cytological findings in specimens from cervix uteri: Secondary | ICD-10-CM | POA: Diagnosis not present

## 2018-05-10 DIAGNOSIS — N920 Excessive and frequent menstruation with regular cycle: Secondary | ICD-10-CM | POA: Diagnosis not present

## 2018-06-06 DIAGNOSIS — M542 Cervicalgia: Secondary | ICD-10-CM | POA: Diagnosis not present

## 2018-06-17 DIAGNOSIS — M4802 Spinal stenosis, cervical region: Secondary | ICD-10-CM | POA: Diagnosis not present

## 2018-06-17 DIAGNOSIS — G479 Sleep disorder, unspecified: Secondary | ICD-10-CM | POA: Diagnosis not present

## 2018-06-17 DIAGNOSIS — Z6831 Body mass index (BMI) 31.0-31.9, adult: Secondary | ICD-10-CM | POA: Diagnosis not present

## 2018-06-19 ENCOUNTER — Telehealth: Payer: 59 | Admitting: Nurse Practitioner

## 2018-06-19 DIAGNOSIS — H938X2 Other specified disorders of left ear: Secondary | ICD-10-CM

## 2018-06-19 DIAGNOSIS — E250 Congenital adrenogenital disorders associated with enzyme deficiency: Secondary | ICD-10-CM | POA: Diagnosis not present

## 2018-06-19 NOTE — Progress Notes (Signed)
Based on what you shared with me it looks like you have possible ear wax impaction,that should be evaluated in a face to face office visit. You need to have ear physically looked out and flushed out if needed.   NOTE: If you entered your credit card information for this eVisit, you will not be charged. You may see a "hold" on your card for the $30 but that hold will drop off and you will not have a charge processed.  If you are having a true medical emergency please call 911.  If you need an urgent face to face visit, [place] has four urgent care centers for your convenience.  If you need care fast and have a high deductible or no insurance consider:   [email or web address] to reserve your spot online an avoid wait times  [facility] [address] [place], [place] [address] 8 am to 8 pm Monday-Friday 10 am to 4 pm Saturday-Sunday *Across the street from [employer]  [address] [place], [address] 8 am to 5 pm Monday-Friday * In the [facility] on the [facility]   The following sites will take your  insurance:  . [facility]  [phone] Get Driving Directions Find a Provider at this Location  [address] [place], [place] [address] . 10 am to 8 pm Monday-Friday . 12 pm to 8 pm Saturday-Sunday   . [facility] at [facility]  [phone] Get Driving Directions Find a Provider at this Location  [address] [place], [place] [address] . 8 am to 8 pm Monday-Friday . 9 am to 6 pm Saturday . 11 am to 6 pm Sunday   . [facility] at [facility]  [phone] Get Driving Directions  [address] [place], [place] [address] . 8 am to 8 pm Monday-Friday . 8 am to 4 pm Saturday-Sunday   Your e-visit answers were reviewed by a board certified advanced clinical practitioner to complete your personal care plan.

## 2018-06-22 DIAGNOSIS — M4803 Spinal stenosis, cervicothoracic region: Secondary | ICD-10-CM | POA: Diagnosis not present

## 2018-06-22 DIAGNOSIS — M4802 Spinal stenosis, cervical region: Secondary | ICD-10-CM | POA: Diagnosis not present

## 2018-06-22 DIAGNOSIS — M5023 Other cervical disc displacement, cervicothoracic region: Secondary | ICD-10-CM | POA: Diagnosis not present

## 2018-06-26 DIAGNOSIS — Z1231 Encounter for screening mammogram for malignant neoplasm of breast: Secondary | ICD-10-CM | POA: Diagnosis not present

## 2018-06-28 DIAGNOSIS — M542 Cervicalgia: Secondary | ICD-10-CM | POA: Diagnosis not present

## 2018-07-01 ENCOUNTER — Other Ambulatory Visit (HOSPITAL_COMMUNITY)
Admission: RE | Admit: 2018-07-01 | Discharge: 2018-07-01 | Disposition: A | Discharge status: Home / Self Care | Payer: 59 | Source: Ambulatory Visit | Attending: Obstetrics and Gynecology | Admitting: Obstetrics and Gynecology

## 2018-07-01 ENCOUNTER — Ambulatory Visit (INDEPENDENT_AMBULATORY_CARE_PROVIDER_SITE_OTHER): Payer: 59 | Admitting: Obstetrics and Gynecology

## 2018-07-01 ENCOUNTER — Ambulatory Visit: Payer: 59 | Admitting: Obstetrics and Gynecology

## 2018-07-01 ENCOUNTER — Encounter: Payer: Self-pay | Admitting: Obstetrics and Gynecology

## 2018-07-01 VITALS — BP 122/78 | Ht 64.0 in | Wt 180.0 lb

## 2018-07-01 DIAGNOSIS — R87610 Atypical squamous cells of undetermined significance on cytologic smear of cervix (ASC-US): Secondary | ICD-10-CM | POA: Insufficient documentation

## 2018-07-01 DIAGNOSIS — R87810 Cervical high risk human papillomavirus (HPV) DNA test positive: Secondary | ICD-10-CM

## 2018-07-01 DIAGNOSIS — N870 Mild cervical dysplasia: Secondary | ICD-10-CM

## 2018-07-01 DIAGNOSIS — R87619 Unspecified abnormal cytological findings in specimens from cervix uteri: Secondary | ICD-10-CM

## 2018-07-01 NOTE — Progress Notes (Signed)
HPI:  Sandy Mitchell is a 48 y.o.  [phone]  who presents today for evaluation and management of abnormal cervical cytology.    Dysplasia History:  04/2016: NILM, HPV 16 positive 06/2016 - colposcopy CIN 1 06/11/17: ASCUS-HPV positive 05/2018 - AGUS (likely endocervical), also ASCUS, HPV+  OB History  Gravida Para Term Preterm AB Living  3 3 2 1   3   SAB TAB Ectopic Multiple Live Births          3    # Outcome Date GA Lbr Len/2nd Weight Sex Delivery Anes PTL Lv  3 Preterm           2 Term           1 Term             Past Medical History:  Diagnosis Date  . Abnormal Pap smear of cervix   . Anxiety   . Chronic neck pain   . Depression   . Fibromyalgia   . Fibromyalgia   . GERD (gastroesophageal reflux disease)   . Hypothyroid   . Migraine   . Migraine   . Migraine   . Sleep disorder   . Thyroid disease     Past Surgical History:  Procedure Laterality Date  . TUBAL LIGATION      SOCIAL HISTORY:  Social History   Substance and Sexual Activity  Alcohol Use No  . Alcohol/week: 0.0 standard drinks    Social History   Substance and Sexual Activity  Drug Use No     Family History  Problem Relation Age of Onset  . Healthy Mother   . Kidney failure Father   . Hypertension Father   . Diabetes Father   . Colon cancer Father   . Lung cancer Maternal Grandfather   . Lung cancer Paternal Grandfather     ALLERGIES:  Amlodipine; Melatonin; Phenergan [promethazine hcl]; Trazodone and nefazodone; Atenolol; and Nystatin  Current Outpatient Medications on File Prior to Visit  Medication Sig Dispense Refill  . butalbital-acetaminophen-caffeine (FIORICET, ESGIC) 50-325-40 MG tablet Take 1 tablet by mouth every 6 (six) hours as needed for headache.    Marland Kitchen FLECTOR 1.3 % PTCH Place 1 patch onto the skin 2 (two) times daily.   0  . fluticasone (FLONASE) 50 MCG/ACT nasal spray Place 1 spray into both nostrils daily as needed for allergies or rhinitis.     Marland Kitchen gabapentin  (NEURONTIN) 100 MG capsule Take 1-3 pills daily of this for fibromyalgia    . levothyroxine (SYNTHROID, LEVOTHROID) 88 MCG tablet Take 88 mcg by mouth daily before breakfast.    . modafinil (PROVIGIL) 200 MG tablet Take 200 mg by mouth daily.   0  . neomycin-polymyxin-hydrocortisone (CORTISPORIN) 3.5-10000-1 ophthalmic suspension Place 3 drops into the right eye 4 (four) times daily. 7.5 mL 0  . ondansetron (ZOFRAN) 4 MG tablet Take 4 mg by mouth every 8 (eight) hours as needed for nausea or vomiting.    . ranitidine (ZANTAC) 150 MG tablet Take 150 mg by mouth.    . rizatriptan (MAXALT) 10 MG tablet Take 1 tab at headache onset may repeat once in two hours if needed    . sulfamethoxazole-trimethoprim (BACTRIM DS,SEPTRA DS) 800-160 MG tablet Take 1 tablet by mouth 2 (two) times daily. 14 tablet 0  . tobramycin (TOBREX) 0.3 % ophthalmic solution Place 2 drops into the left eye every 4 (four) hours. 5 mL 0  . topiramate (TOPAMAX) 50 MG tablet 1 tab nightly for  1 week, then increase 1 tab twice a day for 1 week then increase  1 tabam, 2tabPM for a week then increase to 2tabs bid     No current facility-administered medications on file prior to visit.     Physical Exam: -Vitals:  BP 122/78   Ht 5\' 4"  (1.626 m)   Wt 180 lb (81.6 kg)   LMP 06/24/2018   BMI 30.90 kg/m  GEN: WD, WN, NAD.  A+ O x 3, good mood and affect. ABD:  NT, ND.  Soft, no masses.  No hernias noted.   Pelvic:   Vulva: Normal appearance.  No lesions.  Vagina: No lesions or abnormalities noted.  Support: Normal pelvic support.  Urethra No masses tenderness or scarring.  Meatus Normal size without lesions or prolapse.  Cervix: See below.  Anus: Normal exam.  No lesions.  Perineum: Normal exam.  No lesions.        Bimanual   Uterus: Normal size.  Non-tender.  Mobile.  AV.  Adnexae: No masses.  Non-tender to palpation.  Cul-de-sac: Negative for abnormality.   PROCEDURE: 1.  Urine Pregnancy Test:  not done 2.  Colposcopy  performed with 4% acetic acid after verbal consent obtained                                         -Aceto-white Lesions Location(s): diffusely o'clock.              -Biopsy performed at 2, 4, 8, and 10 o'clock               -ECC indicated and performed: Yes.       -Biopsy sites made hemostatic with pressure, AgNO3, and/or Monsel's solution   -Satisfactory colposcopy: No.    -Evidence of Invasive cervical CA :  NO  Endometrial Biopsy After discussion with the patient regarding her abnormal uterine bleeding I recommended that she proceed with an endometrial biopsy for further diagnosis. The risks, benefits, alternatives, and indications for an endometrial biopsy were discussed with the patient in detail. She understood the risks including infection, bleeding, cervical laceration and uterine perforation.  Verbal consent was obtained.   PROCEDURE NOTE:  Pipelle endometrial biopsy was performed using aseptic technique with iodine preparation.  The uterus was sounded to a length of 8 cm.  Adequate sampling was obtained with minimal blood loss.  The patient tolerated the procedure well.  Disposition will be pending pathology.   ASSESSMENT:  Sandy Mitchell is a 48 y.o. [phone] here for  1. Atypical glandular cells of undetermined significance (AGUS) on cervical Pap smear   2. ASCUS with positive high risk HPV cervical   .  PLAN:  I discussed the grading system of pap smears and HPV high risk viral types.  We will discuss and base management after colpo results return.     Thomasene Mohair, MD  [facility], [facility] 07/03/2018  10:36 AM

## 2018-07-03 ENCOUNTER — Encounter: Payer: Self-pay | Admitting: Obstetrics and Gynecology

## 2018-07-03 DIAGNOSIS — R200 Anesthesia of skin: Secondary | ICD-10-CM | POA: Diagnosis not present

## 2018-07-03 DIAGNOSIS — G43909 Migraine, unspecified, not intractable, without status migrainosus: Secondary | ICD-10-CM | POA: Diagnosis not present

## 2018-07-03 DIAGNOSIS — M542 Cervicalgia: Secondary | ICD-10-CM | POA: Diagnosis not present

## 2018-07-03 DIAGNOSIS — R400 Somnolence: Secondary | ICD-10-CM | POA: Diagnosis not present

## 2018-07-18 DIAGNOSIS — E669 Obesity, unspecified: Secondary | ICD-10-CM | POA: Insufficient documentation

## 2018-07-18 DIAGNOSIS — Z6831 Body mass index (BMI) 31.0-31.9, adult: Secondary | ICD-10-CM | POA: Diagnosis not present

## 2018-07-18 DIAGNOSIS — Z8371 Family history of colonic polyps: Secondary | ICD-10-CM | POA: Diagnosis not present

## 2018-07-18 DIAGNOSIS — M4802 Spinal stenosis, cervical region: Secondary | ICD-10-CM | POA: Diagnosis not present

## 2018-07-18 DIAGNOSIS — F329 Major depressive disorder, single episode, unspecified: Secondary | ICD-10-CM | POA: Diagnosis not present

## 2018-07-18 DIAGNOSIS — E039 Hypothyroidism, unspecified: Secondary | ICD-10-CM | POA: Diagnosis not present

## 2018-07-18 DIAGNOSIS — R87619 Unspecified abnormal cytological findings in specimens from cervix uteri: Secondary | ICD-10-CM | POA: Diagnosis not present

## 2018-07-18 DIAGNOSIS — Z0000 Encounter for general adult medical examination without abnormal findings: Secondary | ICD-10-CM | POA: Diagnosis not present

## 2018-07-26 DIAGNOSIS — M542 Cervicalgia: Secondary | ICD-10-CM | POA: Diagnosis not present

## 2018-09-02 DIAGNOSIS — G8929 Other chronic pain: Secondary | ICD-10-CM | POA: Diagnosis not present

## 2018-09-15 ENCOUNTER — Telehealth: Payer: 59 | Admitting: Family

## 2018-09-15 DIAGNOSIS — J029 Acute pharyngitis, unspecified: Secondary | ICD-10-CM | POA: Diagnosis not present

## 2018-09-15 MED ORDER — AMOXICILLIN 875 MG PO TABS: 875.0000 mg | ORAL_TABLET | Freq: Two times a day (BID) | ORAL | 0 refills | Status: DC

## 2018-09-15 NOTE — Progress Notes (Signed)
We are sorry that you are not feeling well.  Here is how we plan to help!  Based on what you have shared with me it is likely that you have strep pharyngitis.  Strep pharyngitis is inflammation and infection in the back of the throat.  This is an infection cause by bacteria and is treated with antibiotics.  I have prescribed Amoxicillin  875 mg one tablet twice daily for 7 days. For throat pain, we recommend over the counter oral pain relief medications such as acetaminophen or aspirin, or anti-inflammatory medications such as ibuprofen or naproxen sodium. Topical treatments such as oral throat lozenges or sprays may be used as needed. Strep infections are not as easily transmitted as other respiratory infections, however we still recommend that you avoid close contact with loved ones, especially the very young and elderly.  Remember to wash your hands thoroughly throughout the day as this is the number one way to prevent the spread of infection and wipe down door knobs and counters with disinfectant.  Based on your symptoms, it would be best if you quarantined for the next 14 days.   Approximately 5 minutes was spent documenting and reviewing patient's chart.   Home Care:  Only take medications as instructed by your medical team.  Complete the entire course of an antibiotic.  Do not take these medications with alcohol.  A steam or ultrasonic humidifier can help congestion.  You can place a towel over your head and breathe in the steam from hot water coming from a faucet.  Avoid close contacts especially the very young and the elderly.  Cover your mouth when you cough or sneeze.  Always remember to wash your hands.  Get Help Right Away If:  You develop worsening fever or sinus pain.  You develop a severe head ache or visual changes.  Your symptoms persist after you have completed your treatment plan.  Make sure you  Understand these instructions.  Will watch your  condition.  Will get help right away if you are not doing well or get worse.  Your e-visit answers were reviewed by a board certified advanced clinical practitioner to complete your personal care plan.  Depending on the condition, your plan could have included both over the counter or prescription medications.  If there is a problem please reply  once you have received a response from your provider.  Your safety is important to [place].  If you have drug allergies check your prescription carefully.    You can use MyChart to ask questions about today's visit, request a non-urgent call back, or ask for a work or school excuse for 24 hours related to this e-Visit. If it has been greater than 24 hours you will need to follow up with your provider, or enter a new e-Visit to address those concerns.  You will get an e-mail in the next two days asking about your experience.  I hope that your e-visit has been valuable and will speed your recovery. Thank you for using e-visits.

## 2018-09-17 DIAGNOSIS — Z760 Encounter for issue of repeat prescription: Secondary | ICD-10-CM | POA: Diagnosis not present

## 2018-09-17 DIAGNOSIS — J020 Streptococcal pharyngitis: Secondary | ICD-10-CM | POA: Diagnosis not present

## 2018-09-17 DIAGNOSIS — R110 Nausea: Secondary | ICD-10-CM | POA: Diagnosis not present

## 2018-10-01 MED FILL — DICLOFENAC EPOLAMINE 1.3 %: 1.3 | 15 days supply | Qty: 30 | Fill #0

## 2018-10-01 MED FILL — PREGABALIN 50 MG CAPS: 50 | 30 days supply | Qty: 60 | Fill #0

## 2018-10-01 MED FILL — RIZATRIPTAN BENZOATE 10 MG: 10 | 30 days supply | Qty: 10 | Fill #0

## 2018-10-01 MED FILL — MODAFINIL 100 MG TABLET: 100 | 30 days supply | Qty: 60 | Fill #0

## 2018-10-01 MED FILL — AIMOVIG 140 MG/ML SOAJ: 140 | 30 days supply | Qty: 1 | Fill #0 | Status: TO

## 2018-10-01 MED FILL — TOPIRAMATE 50 MG TABLET: 50 | 30 days supply | Qty: 120 | Fill #0

## 2018-11-14 DIAGNOSIS — Z6831 Body mass index (BMI) 31.0-31.9, adult: Secondary | ICD-10-CM | POA: Diagnosis not present

## 2018-11-14 DIAGNOSIS — G43719 Chronic migraine without aura, intractable, without status migrainosus: Secondary | ICD-10-CM | POA: Diagnosis not present

## 2018-12-04 DIAGNOSIS — G8929 Other chronic pain: Secondary | ICD-10-CM | POA: Diagnosis not present

## 2018-12-12 DIAGNOSIS — G8929 Other chronic pain: Secondary | ICD-10-CM | POA: Diagnosis not present

## 2018-12-15 ENCOUNTER — Telehealth: Payer: 59 | Admitting: Family

## 2018-12-15 DIAGNOSIS — H0015 Chalazion left lower eyelid: Secondary | ICD-10-CM

## 2018-12-15 NOTE — Progress Notes (Signed)
We are sorry that you are not feeling well. Here is how we plan to help!  Based on what you have shared with me it looks like you have a chalazion. This is usually a painless localized eyelid swelling. Continue to use the warm compresses. These usually do not need anabiotic   We have made appropriate suggestions for you based upon your presentation: Simple chalazion can be treated without medical intervention.  Most chalazion  either resolve spontaneously or resolve with simple home treatment by applying warm compresses or heated washcloth to the stye for about 10-15 minutes three to four times a day.   Approximately 5 minutes was spent documenting and reviewing patient's chart.    HOME CARE:   Wash your hands often!  Let the stye open on its own. Don't squeeze or open it.  Don't rub your eyes. This can irritate your eyes and let in bacteria.  If you need to touch your eyes, wash your hands first.  Don't wear eye makeup or contact lenses until the area has healed.  GET HELP RIGHT AWAY IF:   Your symptoms do not improve.  You develop blurred or loss of vision.  Your symptoms worsen (increased discharge, pain or redness).  Thank you for choosing an e-visit.  Your e-visit answers were reviewed by a board certified advanced clinical practitioner to complete your personal care plan.  Depending upon the condition, your plan could have included both over the counter or prescription medications.  Please review your pharmacy choice.  Make sure the pharmacy is open so you can pick up prescription now.  If there is a problem, you may contact your provider through [employer] and have the prescription routed to another pharmacy.    Your safety is important to [place].  If you have drug allergies check your prescription carefully.  For the next 24 hours you can use MyChart to ask questions about today's visit, request a non-urgent call back, or ask for a work or school excuse.  You will  get an email in the next two days asking about your experience.  I hope you that your e-visit has been valuable and will speed your recovery.

## 2018-12-23 DIAGNOSIS — M5412 Radiculopathy, cervical region: Secondary | ICD-10-CM | POA: Diagnosis not present

## 2019-01-01 DIAGNOSIS — E282 Polycystic ovarian syndrome: Secondary | ICD-10-CM | POA: Diagnosis not present

## 2019-01-02 DIAGNOSIS — M5013 Cervical disc disorder with radiculopathy, cervicothoracic region: Secondary | ICD-10-CM | POA: Diagnosis not present

## 2019-01-02 DIAGNOSIS — M4312 Spondylolisthesis, cervical region: Secondary | ICD-10-CM | POA: Diagnosis not present

## 2019-01-02 DIAGNOSIS — M4803 Spinal stenosis, cervicothoracic region: Secondary | ICD-10-CM | POA: Diagnosis not present

## 2019-01-02 DIAGNOSIS — Z981 Arthrodesis status: Secondary | ICD-10-CM | POA: Diagnosis not present

## 2019-01-02 DIAGNOSIS — M50121 Cervical disc disorder at C4-C5 level with radiculopathy: Secondary | ICD-10-CM | POA: Diagnosis not present

## 2019-02-04 DIAGNOSIS — E282 Polycystic ovarian syndrome: Secondary | ICD-10-CM | POA: Diagnosis not present

## 2019-04-03 DIAGNOSIS — Z6829 Body mass index (BMI) 29.0-29.9, adult: Secondary | ICD-10-CM | POA: Diagnosis not present

## 2019-04-03 DIAGNOSIS — G479 Sleep disorder, unspecified: Secondary | ICD-10-CM | POA: Diagnosis not present

## 2019-04-03 DIAGNOSIS — G43009 Migraine without aura, not intractable, without status migrainosus: Secondary | ICD-10-CM | POA: Diagnosis not present

## 2019-04-03 DIAGNOSIS — M542 Cervicalgia: Secondary | ICD-10-CM | POA: Diagnosis not present

## 2019-04-03 DIAGNOSIS — E039 Hypothyroidism, unspecified: Secondary | ICD-10-CM | POA: Diagnosis not present

## 2019-04-11 DIAGNOSIS — N760 Acute vaginitis: Secondary | ICD-10-CM | POA: Diagnosis not present

## 2019-04-11 DIAGNOSIS — Z6830 Body mass index (BMI) 30.0-30.9, adult: Secondary | ICD-10-CM | POA: Diagnosis not present

## 2019-05-06 DIAGNOSIS — E282 Polycystic ovarian syndrome: Secondary | ICD-10-CM | POA: Diagnosis not present

## 2019-05-09 DIAGNOSIS — R198 Other specified symptoms and signs involving the digestive system and abdomen: Secondary | ICD-10-CM | POA: Diagnosis not present

## 2019-05-09 DIAGNOSIS — Z6830 Body mass index (BMI) 30.0-30.9, adult: Secondary | ICD-10-CM | POA: Diagnosis not present

## 2019-05-09 DIAGNOSIS — N898 Other specified noninflammatory disorders of vagina: Secondary | ICD-10-CM | POA: Diagnosis not present

## 2019-05-15 DIAGNOSIS — G43909 Migraine, unspecified, not intractable, without status migrainosus: Secondary | ICD-10-CM | POA: Diagnosis not present

## 2019-05-27 DIAGNOSIS — H02402 Unspecified ptosis of left eyelid: Secondary | ICD-10-CM | POA: Diagnosis not present

## 2019-05-27 DIAGNOSIS — H04123 Dry eye syndrome of bilateral lacrimal glands: Secondary | ICD-10-CM | POA: Diagnosis not present

## 2019-06-26 DIAGNOSIS — G479 Sleep disorder, unspecified: Secondary | ICD-10-CM | POA: Diagnosis not present

## 2019-06-26 DIAGNOSIS — E039 Hypothyroidism, unspecified: Secondary | ICD-10-CM | POA: Diagnosis not present

## 2019-06-26 DIAGNOSIS — G43719 Chronic migraine without aura, intractable, without status migrainosus: Secondary | ICD-10-CM | POA: Diagnosis not present

## 2019-06-26 DIAGNOSIS — Z6829 Body mass index (BMI) 29.0-29.9, adult: Secondary | ICD-10-CM | POA: Diagnosis not present

## 2019-07-07 DIAGNOSIS — H02886 Meibomian gland dysfunction of left eye, unspecified eyelid: Secondary | ICD-10-CM | POA: Diagnosis not present

## 2019-07-07 DIAGNOSIS — H04123 Dry eye syndrome of bilateral lacrimal glands: Secondary | ICD-10-CM | POA: Diagnosis not present

## 2019-07-07 DIAGNOSIS — M3500 Sicca syndrome, unspecified: Secondary | ICD-10-CM | POA: Diagnosis not present

## 2019-07-07 DIAGNOSIS — H02883 Meibomian gland dysfunction of right eye, unspecified eyelid: Secondary | ICD-10-CM | POA: Diagnosis not present

## 2019-07-08 DIAGNOSIS — E282 Polycystic ovarian syndrome: Secondary | ICD-10-CM | POA: Diagnosis not present

## 2019-07-08 DIAGNOSIS — H04123 Dry eye syndrome of bilateral lacrimal glands: Secondary | ICD-10-CM | POA: Diagnosis not present

## 2019-07-08 DIAGNOSIS — H02886 Meibomian gland dysfunction of left eye, unspecified eyelid: Secondary | ICD-10-CM | POA: Diagnosis not present

## 2019-07-08 DIAGNOSIS — H02883 Meibomian gland dysfunction of right eye, unspecified eyelid: Secondary | ICD-10-CM | POA: Diagnosis not present

## 2019-07-23 DIAGNOSIS — K219 Gastro-esophageal reflux disease without esophagitis: Secondary | ICD-10-CM | POA: Diagnosis not present

## 2019-07-23 DIAGNOSIS — H02409 Unspecified ptosis of unspecified eyelid: Secondary | ICD-10-CM | POA: Diagnosis not present

## 2019-07-23 DIAGNOSIS — R0789 Other chest pain: Secondary | ICD-10-CM | POA: Diagnosis not present

## 2019-07-23 DIAGNOSIS — R5383 Other fatigue: Secondary | ICD-10-CM | POA: Diagnosis not present

## 2019-08-01 DIAGNOSIS — G8929 Other chronic pain: Secondary | ICD-10-CM | POA: Diagnosis not present

## 2019-08-15 DIAGNOSIS — G8929 Other chronic pain: Secondary | ICD-10-CM | POA: Diagnosis not present

## 2019-08-22 ENCOUNTER — Telehealth: Payer: 59 | Admitting: Nurse Practitioner

## 2019-08-22 DIAGNOSIS — L03116 Cellulitis of left lower limb: Secondary | ICD-10-CM

## 2019-08-22 MED ORDER — CEPHALEXIN 500 MG PO CAPS: 500.0000 mg | ORAL_CAPSULE | Freq: Four times a day (QID) | ORAL | 0 refills | Status: DC

## 2019-08-22 NOTE — Progress Notes (Signed)

## 2019-08-29 DIAGNOSIS — Z6831 Body mass index (BMI) 31.0-31.9, adult: Secondary | ICD-10-CM | POA: Diagnosis not present

## 2019-08-29 DIAGNOSIS — M5412 Radiculopathy, cervical region: Secondary | ICD-10-CM | POA: Diagnosis not present

## 2019-08-29 DIAGNOSIS — G479 Sleep disorder, unspecified: Secondary | ICD-10-CM | POA: Diagnosis not present

## 2019-09-05 DIAGNOSIS — G8929 Other chronic pain: Secondary | ICD-10-CM | POA: Diagnosis not present

## 2019-09-16 DIAGNOSIS — M5033 Other cervical disc degeneration, cervicothoracic region: Secondary | ICD-10-CM | POA: Diagnosis not present

## 2019-09-16 DIAGNOSIS — M4803 Spinal stenosis, cervicothoracic region: Secondary | ICD-10-CM | POA: Diagnosis not present

## 2019-09-16 DIAGNOSIS — M4802 Spinal stenosis, cervical region: Secondary | ICD-10-CM | POA: Diagnosis not present

## 2019-09-16 DIAGNOSIS — M5412 Radiculopathy, cervical region: Secondary | ICD-10-CM | POA: Diagnosis not present

## 2019-09-19 DIAGNOSIS — G8929 Other chronic pain: Secondary | ICD-10-CM | POA: Diagnosis not present

## 2019-09-20 DIAGNOSIS — G8929 Other chronic pain: Secondary | ICD-10-CM | POA: Diagnosis not present

## 2019-10-02 DIAGNOSIS — E282 Polycystic ovarian syndrome: Secondary | ICD-10-CM | POA: Diagnosis not present

## 2019-10-06 DIAGNOSIS — H04123 Dry eye syndrome of bilateral lacrimal glands: Secondary | ICD-10-CM | POA: Diagnosis not present

## 2019-10-09 DIAGNOSIS — G479 Sleep disorder, unspecified: Secondary | ICD-10-CM | POA: Diagnosis not present

## 2019-10-09 DIAGNOSIS — G4719 Other hypersomnia: Secondary | ICD-10-CM | POA: Diagnosis not present

## 2019-10-09 DIAGNOSIS — G4726 Circadian rhythm sleep disorder, shift work type: Secondary | ICD-10-CM | POA: Diagnosis not present

## 2019-10-09 DIAGNOSIS — G4733 Obstructive sleep apnea (adult) (pediatric): Secondary | ICD-10-CM | POA: Diagnosis not present

## 2019-10-09 DIAGNOSIS — Z6831 Body mass index (BMI) 31.0-31.9, adult: Secondary | ICD-10-CM | POA: Diagnosis not present

## 2019-10-15 ENCOUNTER — Other Ambulatory Visit: Payer: Self-pay | Admitting: Physician Assistant

## 2019-10-15 DIAGNOSIS — G43909 Migraine, unspecified, not intractable, without status migrainosus: Secondary | ICD-10-CM | POA: Diagnosis not present

## 2019-10-20 DIAGNOSIS — G43719 Chronic migraine without aura, intractable, without status migrainosus: Secondary | ICD-10-CM | POA: Diagnosis not present

## 2019-10-21 DIAGNOSIS — M5033 Other cervical disc degeneration, cervicothoracic region: Secondary | ICD-10-CM | POA: Diagnosis not present

## 2019-10-21 DIAGNOSIS — M5412 Radiculopathy, cervical region: Secondary | ICD-10-CM | POA: Diagnosis not present

## 2019-10-23 DIAGNOSIS — G43719 Chronic migraine without aura, intractable, without status migrainosus: Secondary | ICD-10-CM | POA: Diagnosis not present

## 2019-10-23 DIAGNOSIS — J4520 Mild intermittent asthma, uncomplicated: Secondary | ICD-10-CM | POA: Diagnosis not present

## 2019-10-23 DIAGNOSIS — R87619 Unspecified abnormal cytological findings in specimens from cervix uteri: Secondary | ICD-10-CM | POA: Diagnosis not present

## 2019-10-23 DIAGNOSIS — M5412 Radiculopathy, cervical region: Secondary | ICD-10-CM | POA: Diagnosis not present

## 2019-10-23 DIAGNOSIS — F329 Major depressive disorder, single episode, unspecified: Secondary | ICD-10-CM | POA: Diagnosis not present

## 2019-10-23 DIAGNOSIS — Z01419 Encounter for gynecological examination (general) (routine) without abnormal findings: Secondary | ICD-10-CM | POA: Diagnosis not present

## 2019-10-23 DIAGNOSIS — E669 Obesity, unspecified: Secondary | ICD-10-CM | POA: Diagnosis not present

## 2019-10-23 DIAGNOSIS — Z6831 Body mass index (BMI) 31.0-31.9, adult: Secondary | ICD-10-CM | POA: Diagnosis not present

## 2019-10-23 DIAGNOSIS — E039 Hypothyroidism, unspecified: Secondary | ICD-10-CM | POA: Diagnosis not present

## 2019-10-23 DIAGNOSIS — Z0000 Encounter for general adult medical examination without abnormal findings: Secondary | ICD-10-CM | POA: Diagnosis not present

## 2019-10-23 DIAGNOSIS — Z79899 Other long term (current) drug therapy: Secondary | ICD-10-CM | POA: Diagnosis not present

## 2019-11-17 DIAGNOSIS — F419 Anxiety disorder, unspecified: Secondary | ICD-10-CM | POA: Diagnosis not present

## 2019-11-17 DIAGNOSIS — E669 Obesity, unspecified: Secondary | ICD-10-CM | POA: Diagnosis not present

## 2019-11-17 DIAGNOSIS — F329 Major depressive disorder, single episode, unspecified: Secondary | ICD-10-CM | POA: Diagnosis not present

## 2019-11-17 DIAGNOSIS — Z566 Other physical and mental strain related to work: Secondary | ICD-10-CM | POA: Diagnosis not present

## 2019-11-19 DIAGNOSIS — Z566 Other physical and mental strain related to work: Secondary | ICD-10-CM | POA: Diagnosis not present

## 2019-11-26 DIAGNOSIS — Z6832 Body mass index (BMI) 32.0-32.9, adult: Secondary | ICD-10-CM | POA: Diagnosis not present

## 2019-11-26 DIAGNOSIS — R609 Edema, unspecified: Secondary | ICD-10-CM | POA: Insufficient documentation

## 2019-11-26 DIAGNOSIS — M5412 Radiculopathy, cervical region: Secondary | ICD-10-CM | POA: Diagnosis not present

## 2019-11-29 DIAGNOSIS — G8929 Other chronic pain: Secondary | ICD-10-CM | POA: Diagnosis not present

## 2019-12-09 DIAGNOSIS — G894 Chronic pain syndrome: Secondary | ICD-10-CM | POA: Diagnosis not present

## 2019-12-09 DIAGNOSIS — G43909 Migraine, unspecified, not intractable, without status migrainosus: Secondary | ICD-10-CM | POA: Diagnosis not present

## 2019-12-09 DIAGNOSIS — F54 Psychological and behavioral factors associated with disorders or diseases classified elsewhere: Secondary | ICD-10-CM | POA: Diagnosis not present

## 2019-12-09 DIAGNOSIS — G4726 Circadian rhythm sleep disorder, shift work type: Secondary | ICD-10-CM | POA: Diagnosis not present

## 2019-12-12 DIAGNOSIS — G8929 Other chronic pain: Secondary | ICD-10-CM | POA: Diagnosis not present

## 2019-12-16 ENCOUNTER — Other Ambulatory Visit: Payer: Self-pay | Admitting: Physician Assistant

## 2019-12-16 DIAGNOSIS — G43909 Migraine, unspecified, not intractable, without status migrainosus: Secondary | ICD-10-CM | POA: Diagnosis not present

## 2019-12-18 ENCOUNTER — Other Ambulatory Visit: Payer: Self-pay | Admitting: Obstetrics and Gynecology

## 2019-12-18 DIAGNOSIS — E282 Polycystic ovarian syndrome: Secondary | ICD-10-CM | POA: Diagnosis not present

## 2020-01-28 DIAGNOSIS — M797 Fibromyalgia: Secondary | ICD-10-CM | POA: Diagnosis not present

## 2020-01-28 DIAGNOSIS — F329 Major depressive disorder, single episode, unspecified: Secondary | ICD-10-CM | POA: Diagnosis not present

## 2020-01-28 DIAGNOSIS — E039 Hypothyroidism, unspecified: Secondary | ICD-10-CM | POA: Diagnosis not present

## 2020-01-28 DIAGNOSIS — G894 Chronic pain syndrome: Secondary | ICD-10-CM | POA: Diagnosis not present

## 2020-01-30 DIAGNOSIS — G8929 Other chronic pain: Secondary | ICD-10-CM | POA: Diagnosis not present

## 2020-02-02 DIAGNOSIS — G8929 Other chronic pain: Secondary | ICD-10-CM | POA: Diagnosis not present

## 2020-02-02 DIAGNOSIS — Z1231 Encounter for screening mammogram for malignant neoplasm of breast: Secondary | ICD-10-CM | POA: Diagnosis not present

## 2020-02-02 DIAGNOSIS — M19012 Primary osteoarthritis, left shoulder: Secondary | ICD-10-CM | POA: Diagnosis not present

## 2020-02-06 DIAGNOSIS — G8929 Other chronic pain: Secondary | ICD-10-CM | POA: Diagnosis not present

## 2020-02-12 DIAGNOSIS — G8929 Other chronic pain: Secondary | ICD-10-CM | POA: Diagnosis not present

## 2020-02-25 DIAGNOSIS — G8929 Other chronic pain: Secondary | ICD-10-CM | POA: Diagnosis not present

## 2020-02-26 DIAGNOSIS — M5416 Radiculopathy, lumbar region: Secondary | ICD-10-CM | POA: Insufficient documentation

## 2020-02-26 DIAGNOSIS — Z23 Encounter for immunization: Secondary | ICD-10-CM | POA: Diagnosis not present

## 2020-02-26 DIAGNOSIS — M25512 Pain in left shoulder: Secondary | ICD-10-CM | POA: Insufficient documentation

## 2020-02-26 DIAGNOSIS — G8929 Other chronic pain: Secondary | ICD-10-CM | POA: Diagnosis not present

## 2020-03-03 ENCOUNTER — Other Ambulatory Visit: Payer: Self-pay | Admitting: Physician Assistant

## 2020-03-11 ENCOUNTER — Other Ambulatory Visit: Payer: Self-pay | Admitting: Internal Medicine

## 2020-03-15 ENCOUNTER — Other Ambulatory Visit: Payer: Self-pay | Admitting: Internal Medicine

## 2020-03-15 DIAGNOSIS — M5416 Radiculopathy, lumbar region: Secondary | ICD-10-CM | POA: Diagnosis not present

## 2020-03-15 DIAGNOSIS — M48061 Spinal stenosis, lumbar region without neurogenic claudication: Secondary | ICD-10-CM | POA: Diagnosis not present

## 2020-03-17 ENCOUNTER — Other Ambulatory Visit: Payer: Self-pay | Admitting: Internal Medicine

## 2020-04-11 DIAGNOSIS — Y999 Unspecified external cause status: Secondary | ICD-10-CM | POA: Diagnosis not present

## 2020-04-11 DIAGNOSIS — M25572 Pain in left ankle and joints of left foot: Secondary | ICD-10-CM | POA: Diagnosis not present

## 2020-04-11 DIAGNOSIS — S93402A Sprain of unspecified ligament of left ankle, initial encounter: Secondary | ICD-10-CM | POA: Diagnosis not present

## 2020-04-12 DIAGNOSIS — M5126 Other intervertebral disc displacement, lumbar region: Secondary | ICD-10-CM | POA: Diagnosis not present

## 2020-04-12 DIAGNOSIS — Z6831 Body mass index (BMI) 31.0-31.9, adult: Secondary | ICD-10-CM | POA: Diagnosis not present

## 2020-04-12 DIAGNOSIS — M47816 Spondylosis without myelopathy or radiculopathy, lumbar region: Secondary | ICD-10-CM | POA: Diagnosis not present

## 2020-04-12 DIAGNOSIS — Z79891 Long term (current) use of opiate analgesic: Secondary | ICD-10-CM | POA: Diagnosis not present

## 2020-04-12 DIAGNOSIS — Z87891 Personal history of nicotine dependence: Secondary | ICD-10-CM | POA: Diagnosis not present

## 2020-04-12 DIAGNOSIS — M4800 Spinal stenosis, site unspecified: Secondary | ICD-10-CM | POA: Diagnosis not present

## 2020-04-12 DIAGNOSIS — E669 Obesity, unspecified: Secondary | ICD-10-CM | POA: Diagnosis not present

## 2020-04-12 DIAGNOSIS — M5136 Other intervertebral disc degeneration, lumbar region: Secondary | ICD-10-CM | POA: Diagnosis not present

## 2020-04-12 DIAGNOSIS — Z79890 Hormone replacement therapy (postmenopausal): Secondary | ICD-10-CM | POA: Diagnosis not present

## 2020-04-12 DIAGNOSIS — M48061 Spinal stenosis, lumbar region without neurogenic claudication: Secondary | ICD-10-CM | POA: Diagnosis not present

## 2020-04-12 DIAGNOSIS — Z79899 Other long term (current) drug therapy: Secondary | ICD-10-CM | POA: Diagnosis not present

## 2020-04-16 DIAGNOSIS — M545 Low back pain, unspecified: Secondary | ICD-10-CM | POA: Diagnosis not present

## 2020-04-16 DIAGNOSIS — G8929 Other chronic pain: Secondary | ICD-10-CM | POA: Diagnosis not present

## 2020-04-19 ENCOUNTER — Other Ambulatory Visit: Payer: Self-pay | Admitting: Internal Medicine

## 2020-04-21 ENCOUNTER — Other Ambulatory Visit: Payer: Self-pay | Admitting: Internal Medicine

## 2020-04-23 DIAGNOSIS — Z6831 Body mass index (BMI) 31.0-31.9, adult: Secondary | ICD-10-CM | POA: Diagnosis not present

## 2020-04-23 DIAGNOSIS — M25572 Pain in left ankle and joints of left foot: Secondary | ICD-10-CM | POA: Diagnosis not present

## 2020-05-31 DIAGNOSIS — Z6831 Body mass index (BMI) 31.0-31.9, adult: Secondary | ICD-10-CM | POA: Diagnosis not present

## 2020-05-31 DIAGNOSIS — G890 Central pain syndrome: Secondary | ICD-10-CM | POA: Diagnosis not present

## 2020-06-07 ENCOUNTER — Other Ambulatory Visit: Payer: Self-pay

## 2020-08-13 ENCOUNTER — Other Ambulatory Visit: Payer: Self-pay | Admitting: Internal Medicine

## 2020-09-03 ENCOUNTER — Other Ambulatory Visit: Payer: Self-pay | Admitting: Pediatrics

## 2020-09-06 ENCOUNTER — Other Ambulatory Visit: Payer: Self-pay

## 2020-09-06 MED FILL — Tramadol HCl Tab 50 MG: ORAL | 10 days supply | Qty: 30 | Fill #0 | Status: AC

## 2020-09-14 ENCOUNTER — Other Ambulatory Visit: Payer: Self-pay

## 2020-09-26 DIAGNOSIS — R110 Nausea: Secondary | ICD-10-CM | POA: Insufficient documentation

## 2020-09-26 DIAGNOSIS — J020 Streptococcal pharyngitis: Secondary | ICD-10-CM | POA: Insufficient documentation

## 2020-09-26 DIAGNOSIS — E785 Hyperlipidemia, unspecified: Secondary | ICD-10-CM | POA: Insufficient documentation

## 2020-09-26 DIAGNOSIS — F329 Major depressive disorder, single episode, unspecified: Secondary | ICD-10-CM | POA: Insufficient documentation

## 2020-09-26 DIAGNOSIS — R0789 Other chest pain: Secondary | ICD-10-CM | POA: Insufficient documentation

## 2020-09-26 DIAGNOSIS — M545 Low back pain, unspecified: Secondary | ICD-10-CM | POA: Insufficient documentation

## 2020-09-26 DIAGNOSIS — N760 Acute vaginitis: Secondary | ICD-10-CM | POA: Insufficient documentation

## 2020-09-26 DIAGNOSIS — M79604 Pain in right leg: Secondary | ICD-10-CM | POA: Insufficient documentation

## 2020-09-26 DIAGNOSIS — M79609 Pain in unspecified limb: Secondary | ICD-10-CM | POA: Insufficient documentation

## 2020-09-26 DIAGNOSIS — F17200 Nicotine dependence, unspecified, uncomplicated: Secondary | ICD-10-CM | POA: Insufficient documentation

## 2020-09-26 DIAGNOSIS — Z566 Other physical and mental strain related to work: Secondary | ICD-10-CM | POA: Insufficient documentation

## 2020-09-26 DIAGNOSIS — L259 Unspecified contact dermatitis, unspecified cause: Secondary | ICD-10-CM | POA: Insufficient documentation

## 2020-09-26 DIAGNOSIS — G43909 Migraine, unspecified, not intractable, without status migrainosus: Secondary | ICD-10-CM | POA: Insufficient documentation

## 2020-09-26 DIAGNOSIS — S93402S Sprain of unspecified ligament of left ankle, sequela: Secondary | ICD-10-CM | POA: Insufficient documentation

## 2020-09-26 DIAGNOSIS — M48061 Spinal stenosis, lumbar region without neurogenic claudication: Secondary | ICD-10-CM | POA: Insufficient documentation

## 2020-09-26 DIAGNOSIS — G8929 Other chronic pain: Secondary | ICD-10-CM | POA: Insufficient documentation

## 2020-09-26 DIAGNOSIS — L309 Dermatitis, unspecified: Secondary | ICD-10-CM | POA: Insufficient documentation

## 2020-09-26 DIAGNOSIS — R293 Abnormal posture: Secondary | ICD-10-CM | POA: Insufficient documentation

## 2020-09-26 DIAGNOSIS — F439 Reaction to severe stress, unspecified: Secondary | ICD-10-CM | POA: Insufficient documentation

## 2020-09-26 DIAGNOSIS — F419 Anxiety disorder, unspecified: Secondary | ICD-10-CM | POA: Insufficient documentation

## 2020-09-26 DIAGNOSIS — G4726 Circadian rhythm sleep disorder, shift work type: Secondary | ICD-10-CM | POA: Insufficient documentation

## 2020-09-26 DIAGNOSIS — R600 Localized edema: Secondary | ICD-10-CM | POA: Insufficient documentation

## 2020-09-26 DIAGNOSIS — F411 Generalized anxiety disorder: Secondary | ICD-10-CM | POA: Insufficient documentation

## 2020-09-26 DIAGNOSIS — G894 Chronic pain syndrome: Secondary | ICD-10-CM | POA: Insufficient documentation

## 2020-09-26 NOTE — Progress Notes (Signed)
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 kg/m   Wt Readings from Last 4 Encounters:  09/27/20 187 lb (84.8 kg)  07/01/18 180 lb (81.6 kg)  09/06/17 170 lb (77.1 kg)  08/09/17 165 lb (74.8 kg)    Psych/Mental status: Alert, oriented x 3 (person, place, & time)       Eyes: PERLA Respiratory: No evidence of acute respiratory distress  Assessment  Primary Diagnosis & Pertinent Problem List: The primary encounter diagnosis was Chronic low back pain (1ry area of Pain) (Bilateral) (R>L) w/ sciatica (Right). Diagnoses of Chronic lower extremity pain (2ry area of Pain) (Bilateral) (R>L), Spinal stenosis of lumbar region (L4-5), without neurogenic claudication, Lumbar foraminal stenosis (L4-5) (Bilateral), Chronic radicular pain of lower extremity (S1 dermatome) (Bilateral) (R>L), Abnormal MRI, lumbar spinen (03/15/2020), Cervicalgia, History of cervical spinal surgery, Radicular pain of shoulder (Left), Chronic cervical radiculopathy (C8) (Left), EMG (electromyogram) abnormalities (upper extremity),  Chronic shoulder pain (Left), Abnormal MRI, cervical spine (09/16/2019), Chronic pain syndrome, Pharmacologic therapy, Disorder of skeletal system, and Problems influencing health status were also pertinent to this visit.  Visit Diagnosis (New problems to examiner): 1. Chronic low back pain (1ry area of Pain) (Bilateral) (R>L) w/ sciatica (Right)   2. Chronic lower extremity pain (2ry area of Pain) (Bilateral) (R>L)   3. Spinal stenosis of lumbar region (L4-5), without neurogenic claudication   4. Lumbar foraminal stenosis (L4-5) (Bilateral)   5. Chronic radicular pain of lower extremity (S1 dermatome) (Bilateral) (R>L)  6. Abnormal MRI, lumbar spinen (03/15/2020)   7. Cervicalgia   8. History of cervical spinal surgery   9. Radicular pain of shoulder (Left)   10. Chronic cervical radiculopathy (C8) (Left)   11. EMG (electromyogram) abnormalities (upper extremity)   12. Chronic shoulder pain (Left)   13. Abnormal MRI, cervical spine (09/16/2019)   14. Chronic pain syndrome   15. Pharmacologic therapy   16. Disorder of skeletal system   17. Problems influencing health status    Plan of Care (Initial workup plan)  Note: Ms. Munshi was reminded that as per protocol, today's visit has been an evaluation only. We have not taken over the patient's controlled substance management.  Problem-specific plan: No problem-specific Assessment & Plan notes found for this encounter.   Lab Orders     Compliance Drug Analysis, Ur     Comp. Metabolic Panel (12)     Magnesium     Vitamin B12     Sedimentation rate     25-Hydroxy vitamin D Lcms D2+D3     C-reactive protein  Imaging Orders     DG Cervical Spine With Flex & Extend     DG Lumbar Spine Complete W/Bend Referral Orders  No referral(s) requested today    Procedure Orders     Lumbar Epidural Injection     Lumbar Transforaminal Epidural Pharmacotherapy (current): Medications ordered:  No orders of the defined types were placed in  this encounter.  Medications administered during this visit: Sandy Mitchell "Kels" had no medications administered during this visit.   Pharmacological management options:  Opioid Analgesics: The patient was informed that there is no guarantee that she would be a candidate for opioid analgesics. The decision will be made following CDC guidelines. This decision will be based on the results of diagnostic studies, as well as Ms. Napier's risk profile.   Membrane stabilizer: To be determined at a later time  Muscle relaxant: To be determined at a later time  NSAID: To be determined at a later time  Other analgesic(s): To be determined at a later time   Interventional management options: Ms. Christenson was informed that there is no guarantee that she would be a candidate for interventional therapies. The decision will be based on the results of diagnostic studies, as well as Ms. Harbold's risk profile.  Procedure(s) under consideration:  Diagnostic/therapeutic right L4-5 LESI #1  Diagnostic therapeutic bilateral L4 transforaminal ESI #1    Provider-requested follow-up: Return for (40-min), Procedure (w/ sedation): (R) Lesi #1, (s/p Tests).  Future Appointments  Date Time Provider Helen  10/05/2020 10:40 AM Milinda Pointer, MD [facility] None    Note by: Gaspar Cola, MD Date: 09/27/2020; Time: 6:18 PM

## 2020-09-27 ENCOUNTER — Encounter: Payer: Self-pay | Admitting: Pain Medicine

## 2020-09-27 ENCOUNTER — Other Ambulatory Visit: Payer: Self-pay

## 2020-09-27 ENCOUNTER — Ambulatory Visit: Payer: No Typology Code available for payment source | Attending: Pain Medicine | Admitting: Pain Medicine

## 2020-09-27 VITALS — BP 124/81 | HR 93 | Temp 97.2°F | Resp 16 | Ht 62.0 in | Wt 187.0 lb

## 2020-09-27 DIAGNOSIS — Z789 Other specified health status: Secondary | ICD-10-CM

## 2020-09-27 DIAGNOSIS — M48061 Spinal stenosis, lumbar region without neurogenic claudication: Secondary | ICD-10-CM | POA: Diagnosis not present

## 2020-09-27 DIAGNOSIS — Z9889 Other specified postprocedural states: Secondary | ICD-10-CM | POA: Diagnosis present

## 2020-09-27 DIAGNOSIS — M79605 Pain in left leg: Secondary | ICD-10-CM | POA: Diagnosis present

## 2020-09-27 DIAGNOSIS — Z79899 Other long term (current) drug therapy: Secondary | ICD-10-CM | POA: Diagnosis present

## 2020-09-27 DIAGNOSIS — M25512 Pain in left shoulder: Secondary | ICD-10-CM

## 2020-09-27 DIAGNOSIS — R94131 Abnormal electromyogram [EMG]: Secondary | ICD-10-CM

## 2020-09-27 DIAGNOSIS — G894 Chronic pain syndrome: Secondary | ICD-10-CM

## 2020-09-27 DIAGNOSIS — M542 Cervicalgia: Secondary | ICD-10-CM | POA: Diagnosis present

## 2020-09-27 DIAGNOSIS — M5410 Radiculopathy, site unspecified: Secondary | ICD-10-CM | POA: Diagnosis not present

## 2020-09-27 DIAGNOSIS — M5412 Radiculopathy, cervical region: Secondary | ICD-10-CM | POA: Diagnosis present

## 2020-09-27 DIAGNOSIS — M899 Disorder of bone, unspecified: Secondary | ICD-10-CM | POA: Diagnosis present

## 2020-09-27 DIAGNOSIS — G8929 Other chronic pain: Secondary | ICD-10-CM

## 2020-09-27 DIAGNOSIS — M79604 Pain in right leg: Secondary | ICD-10-CM

## 2020-09-27 DIAGNOSIS — R937 Abnormal findings on diagnostic imaging of other parts of musculoskeletal system: Secondary | ICD-10-CM | POA: Insufficient documentation

## 2020-09-27 DIAGNOSIS — M5441 Lumbago with sciatica, right side: Secondary | ICD-10-CM | POA: Diagnosis not present

## 2020-09-27 NOTE — Patient Instructions (Signed)
____________________________________________________________________________________________  Preparing for Procedure with Sedation  Procedure appointments are limited to planned procedures: . No Prescription Refills. . No disability issues will be discussed. . No medication changes will be discussed.  Instructions: . Oral Intake: Do not eat or drink anything for at least 8 hours prior to your procedure. (Exception: Blood Pressure Medication. See below.) . Transportation: Unless otherwise stated by your physician, you may drive yourself after the procedure. . Blood Pressure Medicine: Do not forget to take your blood pressure medicine with a sip of water the morning of the procedure. If your Diastolic (lower reading)is above 100 mmHg, elective cases will be cancelled/rescheduled. . Blood thinners: These will need to be stopped for procedures. Notify our staff if you are taking any blood thinners. Depending on which one you take, there will be specific instructions on how and when to stop it. . Diabetics on insulin: Notify the staff so that you can be scheduled 1st case in the morning. If your diabetes requires high dose insulin, take only  of your normal insulin dose the morning of the procedure and notify the staff that you have done so. . Preventing infections: Shower with an antibacterial soap the morning of your procedure. . Build-up your immune system: Take 1000 mg of Vitamin C with every meal (3 times a day) the day prior to your procedure. . Antibiotics: Inform the staff if you have a condition or reason that requires you to take antibiotics before dental procedures. . Pregnancy: If you are pregnant, call and cancel the procedure. . Sickness: If you have a cold, fever, or any active infections, call and cancel the procedure. . Arrival: You must be in the facility at least 30 minutes prior to your scheduled procedure. . Children: Do not bring children with you. . Dress appropriately:  Bring dark clothing that you would not mind if they get stained. . Valuables: Do not bring any jewelry or valuables.  Reasons to call and reschedule or cancel your procedure: (Following these recommendations will minimize the risk of a serious complication.) . Surgeries: Avoid having procedures within 2 weeks of any surgery. (Avoid for 2 weeks before or after any surgery). . Flu Shots: Avoid having procedures within 2 weeks of a flu shots or . (Avoid for 2 weeks before or after immunizations). . Barium: Avoid having a procedure within 7-10 days after having had a radiological study involving the use of radiological contrast. (Myelograms, Barium swallow or enema study). . Heart attacks: Avoid any elective procedures or surgeries for the initial 6 months after a "Myocardial Infarction" (Heart Attack). . Blood thinners: It is imperative that you stop these medications before procedures. Let us know if you if you take any blood thinner.  . Infection: Avoid procedures during or within two weeks of an infection (including chest colds or gastrointestinal problems). Symptoms associated with infections include: Localized redness, fever, chills, night sweats or profuse sweating, burning sensation when voiding, cough, congestion, stuffiness, runny nose, sore throat, diarrhea, nausea, vomiting, cold or Flu symptoms, recent or current infections. It is specially important if the infection is over the area that we intend to treat. . Heart and lung problems: Symptoms that may suggest an active cardiopulmonary problem include: cough, chest pain, breathing difficulties or shortness of breath, dizziness, ankle swelling, uncontrolled high or unusually low blood pressure, and/or palpitations. If you are experiencing any of these symptoms, cancel your procedure and contact your primary care physician for an evaluation.  Remember:  Regular Business hours are:    Monday to Thursday 8:00 AM to 4:00 PM  Provider's  Schedule: Maleni Seyer, MD:  Procedure days: Tuesday and Thursday 7:30 AM to 4:00 PM  Bilal Lateef, MD:  Procedure days: Monday and Wednesday 7:30 AM to 4:00 PM ____________________________________________________________________________________________   ____________________________________________________________________________________________  General Risks and Possible Complications  Patient Responsibilities: It is important that you read this as it is part of your informed consent. It is our duty to inform you of the risks and possible complications associated with treatments offered to you. It is your responsibility as a patient to read this and to ask questions about anything that is not clear or that you believe was not covered in this document.  Patient's Rights: You have the right to refuse treatment. You also have the right to change your mind, even after initially having agreed to have the treatment done. However, under this last option, if you wait until the last second to change your mind, you may be charged for the materials used up to that point.  Introduction: Medicine is not an exact science. Everything in Medicine, including the lack of treatment(s), carries the potential for danger, harm, or loss (which is by definition: Risk). In Medicine, a complication is a secondary problem, condition, or disease that can aggravate an already existing one. All treatments carry the risk of possible complications. The fact that a side effects or complications occurs, does not imply that the treatment was conducted incorrectly. It must be clearly understood that these can happen even when everything is done following the highest safety standards.  No treatment: You can choose not to proceed with the proposed treatment alternative. The "PRO(s)" would include: avoiding the risk of complications associated with the therapy. The "CON(s)" would include: not getting any of the treatment  benefits. These benefits fall under one of three categories: diagnostic; therapeutic; and/or palliative. Diagnostic benefits include: getting information which can ultimately lead to improvement of the disease or symptom(s). Therapeutic benefits are those associated with the successful treatment of the disease. Finally, palliative benefits are those related to the decrease of the primary symptoms, without necessarily curing the condition (example: decreasing the pain from a flare-up of a chronic condition, such as incurable terminal cancer).  General Risks and Complications: These are associated to most interventional treatments. They can occur alone, or in combination. They fall under one of the following six (6) categories: no benefit or worsening of symptoms; bleeding; infection; nerve damage; allergic reactions; and/or death. 1. No benefits or worsening of symptoms: In Medicine there are no guarantees, only probabilities. No healthcare provider can ever guarantee that a medical treatment will work, they can only state the probability that it may. Furthermore, there is always the possibility that the condition may worsen, either directly, or indirectly, as a consequence of the treatment. 2. Bleeding: This is more common if the patient is taking a blood thinner, either prescription or over the counter (example: Goody Powders, Fish oil, Aspirin, Garlic, etc.), or if suffering a condition associated with impaired coagulation (example: Hemophilia, cirrhosis of the liver, low platelet counts, etc.). However, even if you do not have one on these, it can still happen. If you have any of these conditions, or take one of these drugs, make sure to notify your treating physician. 3. Infection: This is more common in patients with a compromised immune system, either due to disease (example: diabetes, cancer, human immunodeficiency virus [HIV], etc.), or due to medications or treatments (example: therapies used to treat  cancer and   rheumatological diseases). However, even if you do not have one on these, it can still happen. If you have any of these conditions, or take one of these drugs, make sure to notify your treating physician. 4. Nerve Damage: This is more common when the treatment is an invasive one, but it can also happen with the use of medications, such as those used in the treatment of cancer. The damage can occur to small secondary nerves, or to large primary ones, such as those in the spinal cord and brain. This damage may be temporary or permanent and it may lead to impairments that can range from temporary numbness to permanent paralysis and/or brain death. 5. Allergic Reactions: Any time a substance or material comes in contact with our body, there is the possibility of an allergic reaction. These can range from a mild skin rash (contact dermatitis) to a severe systemic reaction (anaphylactic reaction), which can result in death. 6. Death: In general, any medical intervention can result in death, most of the time due to an unforeseen complication. ____________________________________________________________________________________________   

## 2020-09-27 NOTE — Progress Notes (Signed)
Safety precautions to be maintained throughout the outpatient stay will include: orient to surroundings, keep bed in low position, maintain call bell within reach at all times, provide assistance with transfer out of bed and ambulation.  

## 2020-09-28 MED FILL — Modafinil Tab 100 MG: ORAL | 30 days supply | Qty: 60 | Fill #0 | Status: CN

## 2020-09-28 MED FILL — Topiramate Tab 200 MG: ORAL | 90 days supply | Qty: 90 | Fill #0 | Status: CN

## 2020-09-29 ENCOUNTER — Other Ambulatory Visit: Payer: Self-pay

## 2020-09-30 ENCOUNTER — Ambulatory Visit
Admission: RE | Admit: 2020-09-30 | Discharge: 2020-09-30 | Disposition: A | Discharge status: Home / Self Care | Payer: No Typology Code available for payment source | Source: Ambulatory Visit | Attending: Pain Medicine | Admitting: Pain Medicine

## 2020-09-30 ENCOUNTER — Other Ambulatory Visit: Payer: Self-pay

## 2020-09-30 ENCOUNTER — Ambulatory Visit
Admission: RE | Admit: 2020-09-30 | Discharge: 2020-09-30 | Disposition: A | Discharge status: Home / Self Care | Payer: No Typology Code available for payment source | Attending: Pain Medicine | Admitting: Pain Medicine

## 2020-09-30 DIAGNOSIS — M542 Cervicalgia: Secondary | ICD-10-CM

## 2020-09-30 DIAGNOSIS — M5412 Radiculopathy, cervical region: Secondary | ICD-10-CM | POA: Diagnosis present

## 2020-09-30 DIAGNOSIS — M5410 Radiculopathy, site unspecified: Secondary | ICD-10-CM

## 2020-09-30 DIAGNOSIS — M48061 Spinal stenosis, lumbar region without neurogenic claudication: Secondary | ICD-10-CM | POA: Diagnosis present

## 2020-09-30 DIAGNOSIS — G8929 Other chronic pain: Secondary | ICD-10-CM

## 2020-09-30 DIAGNOSIS — M79604 Pain in right leg: Secondary | ICD-10-CM | POA: Insufficient documentation

## 2020-09-30 DIAGNOSIS — M5441 Lumbago with sciatica, right side: Secondary | ICD-10-CM | POA: Insufficient documentation

## 2020-09-30 DIAGNOSIS — Z9889 Other specified postprocedural states: Secondary | ICD-10-CM | POA: Diagnosis present

## 2020-09-30 DIAGNOSIS — M79605 Pain in left leg: Secondary | ICD-10-CM | POA: Insufficient documentation

## 2020-09-30 MED FILL — Topiramate Tab 200 MG: ORAL | 90 days supply | Qty: 90 | Fill #0 | Status: CN

## 2020-10-02 LAB — COMP. METABOLIC PANEL (12)
AST: 24 IU/L (ref 0–40)
Albumin/Globulin Ratio: 1.5 (ref 1.2–2.2)
Albumin: 4.2 g/dL (ref 3.8–4.8)
Alkaline Phosphatase: 85 IU/L (ref 44–121)
BUN/Creatinine Ratio: 22 (ref 9–23)
BUN: 14 mg/dL (ref 6–24)
Bilirubin Total: 0.3 mg/dL (ref 0.0–1.2)
Calcium: 8.5 mg/dL — ABNORMAL LOW (ref 8.7–10.2)
Chloride: 106 mmol/L (ref 96–106)
Creatinine, Ser: 0.65 mg/dL (ref 0.57–1.00)
Globulin, Total: 2.8 g/dL (ref 1.5–4.5)
Glucose: 93 mg/dL (ref 65–99)
Potassium: 4.3 mmol/L (ref 3.5–5.2)
Sodium: 140 mmol/L (ref 134–144)
Total Protein: 7 g/dL (ref 6.0–8.5)
eGFR: 107 mL/min/{1.73_m2} (ref >59)

## 2020-10-02 LAB — MAGNESIUM: Magnesium: 2.2 mg/dL (ref 1.6–2.3)

## 2020-10-02 LAB — SEDIMENTATION RATE: Sed Rate: 25 mm/hr (ref 0–40)

## 2020-10-02 LAB — C-REACTIVE PROTEIN: CRP: 29 mg/L — ABNORMAL HIGH (ref 0–10)

## 2020-10-02 LAB — 25-HYDROXY VITAMIN D LCMS D2+D3
25-Hydroxy, Vitamin D-2: 27 ng/mL
25-Hydroxy, Vitamin D-3: 32 ng/mL
25-Hydroxy, Vitamin D: 59 ng/mL

## 2020-10-02 LAB — VITAMIN B12: Vitamin B-12: 521 pg/mL (ref 232–1245)

## 2020-10-04 ENCOUNTER — Other Ambulatory Visit: Payer: Self-pay

## 2020-10-04 LAB — COMPLIANCE DRUG ANALYSIS, UR

## 2020-10-04 MED ORDER — CLINDAMYCIN HCL 300 MG PO CAPS
ORAL_CAPSULE | ORAL | 0 refills | Status: DC
  Filled 2020-10-04: qty 21, 7d supply, fill #0

## 2020-10-04 MED ORDER — TOPIRAMATE 200 MG PO TABS
200.0000 mg | ORAL_TABLET | Freq: Every day | ORAL | 12 refills | Status: AC
  Filled 2020-10-04: qty 30, 30d supply, fill #0
  Filled 2020-11-17: qty 30, 30d supply, fill #1
  Filled 2020-12-29: qty 30, 30d supply, fill #2
  Filled 2021-03-30: qty 30, 30d supply, fill #3

## 2020-10-04 MED ORDER — TRAMADOL HCL 50 MG PO TABS
ORAL_TABLET | ORAL | 0 refills | Status: DC
  Filled 2020-10-04: qty 15, 5d supply, fill #0

## 2020-10-05 ENCOUNTER — Ambulatory Visit: Payer: No Typology Code available for payment source | Admitting: Pain Medicine

## 2020-11-09 ENCOUNTER — Other Ambulatory Visit: Payer: Self-pay

## 2020-11-09 ENCOUNTER — Encounter: Payer: Self-pay | Admitting: Pain Medicine

## 2020-11-09 ENCOUNTER — Ambulatory Visit
Admission: RE | Admit: 2020-11-09 | Discharge: 2020-11-09 | Disposition: A | Discharge status: Home / Self Care | Payer: No Typology Code available for payment source | Source: Ambulatory Visit | Attending: Pain Medicine | Admitting: Pain Medicine

## 2020-11-09 ENCOUNTER — Ambulatory Visit (HOSPITAL_BASED_OUTPATIENT_CLINIC_OR_DEPARTMENT_OTHER): Payer: No Typology Code available for payment source | Admitting: Pain Medicine

## 2020-11-09 VITALS — BP 122/74 | HR 85 | Temp 97.2°F | Resp 13 | Ht 62.0 in | Wt 180.0 lb

## 2020-11-09 DIAGNOSIS — M79605 Pain in left leg: Secondary | ICD-10-CM | POA: Diagnosis present

## 2020-11-09 DIAGNOSIS — M5416 Radiculopathy, lumbar region: Secondary | ICD-10-CM | POA: Diagnosis not present

## 2020-11-09 DIAGNOSIS — M5410 Radiculopathy, site unspecified: Secondary | ICD-10-CM

## 2020-11-09 DIAGNOSIS — G894 Chronic pain syndrome: Secondary | ICD-10-CM

## 2020-11-09 DIAGNOSIS — G8929 Other chronic pain: Secondary | ICD-10-CM | POA: Diagnosis not present

## 2020-11-09 DIAGNOSIS — R55 Syncope and collapse: Secondary | ICD-10-CM

## 2020-11-09 DIAGNOSIS — Z79899 Other long term (current) drug therapy: Secondary | ICD-10-CM | POA: Insufficient documentation

## 2020-11-09 DIAGNOSIS — Z79891 Long term (current) use of opiate analgesic: Secondary | ICD-10-CM | POA: Diagnosis present

## 2020-11-09 DIAGNOSIS — M48061 Spinal stenosis, lumbar region without neurogenic claudication: Secondary | ICD-10-CM

## 2020-11-09 DIAGNOSIS — R7982 Elevated C-reactive protein (CRP): Secondary | ICD-10-CM | POA: Insufficient documentation

## 2020-11-09 DIAGNOSIS — R937 Abnormal findings on diagnostic imaging of other parts of musculoskeletal system: Secondary | ICD-10-CM | POA: Insufficient documentation

## 2020-11-09 DIAGNOSIS — M5441 Lumbago with sciatica, right side: Secondary | ICD-10-CM

## 2020-11-09 DIAGNOSIS — M5137 Other intervertebral disc degeneration, lumbosacral region: Secondary | ICD-10-CM

## 2020-11-09 DIAGNOSIS — M79604 Pain in right leg: Secondary | ICD-10-CM

## 2020-11-09 DIAGNOSIS — S93402A Sprain of unspecified ligament of left ankle, initial encounter: Secondary | ICD-10-CM | POA: Insufficient documentation

## 2020-11-09 DIAGNOSIS — I959 Hypotension, unspecified: Secondary | ICD-10-CM | POA: Diagnosis present

## 2020-11-09 DIAGNOSIS — R001 Bradycardia, unspecified: Secondary | ICD-10-CM | POA: Insufficient documentation

## 2020-11-09 DIAGNOSIS — F331 Major depressive disorder, recurrent, moderate: Secondary | ICD-10-CM | POA: Insufficient documentation

## 2020-11-09 DIAGNOSIS — R768 Other specified abnormal immunological findings in serum: Secondary | ICD-10-CM | POA: Insufficient documentation

## 2020-11-09 MED ORDER — IOHEXOL 180 MG/ML  SOLN
10.0000 mL | Freq: Once | INTRAMUSCULAR | Status: AC
  Administered 2020-11-09: 10 mL via EPIDURAL

## 2020-11-09 MED ORDER — SODIUM CHLORIDE 0.9% FLUSH
2.0000 mL | Freq: Once | INTRAVENOUS | Status: AC
  Administered 2020-11-09: 2 mL

## 2020-11-09 MED ORDER — IOHEXOL 180 MG/ML  SOLN
INTRAMUSCULAR | Status: AC
  Filled 2020-11-09: qty 20

## 2020-11-09 MED ORDER — ROPIVACAINE HCL 2 MG/ML IJ SOLN
2.0000 mL | Freq: Once | INTRAMUSCULAR | Status: AC
  Administered 2020-11-09: 2 mL via EPIDURAL

## 2020-11-09 MED ORDER — LIDOCAINE HCL 2 % IJ SOLN
INTRAMUSCULAR | Status: AC
  Filled 2020-11-09: qty 20

## 2020-11-09 MED ORDER — TRAMADOL HCL 50 MG PO TABS
50.0000 mg | ORAL_TABLET | Freq: Three times a day (TID) | ORAL | 0 refills | Status: DC | PRN
  Filled 2020-11-09: qty 30, 30d supply, fill #0

## 2020-11-09 MED ORDER — EPHEDRINE SULFATE 50 MG/ML IJ SOLN
10.0000 mg | INTRAMUSCULAR | Status: AC
  Administered 2020-11-09: 10 mg via INTRAMUSCULAR

## 2020-11-09 MED ORDER — SODIUM CHLORIDE (PF) 0.9 % IJ SOLN
INTRAMUSCULAR | Status: AC
  Filled 2020-11-09: qty 10

## 2020-11-09 MED ORDER — TRIAMCINOLONE ACETONIDE 40 MG/ML IJ SUSP
INTRAMUSCULAR | Status: AC
  Filled 2020-11-09: qty 1

## 2020-11-09 MED ORDER — LIDOCAINE HCL 2 % IJ SOLN
20.0000 mL | Freq: Once | INTRAMUSCULAR | Status: AC
  Administered 2020-11-09: 400 mg

## 2020-11-09 MED ORDER — TRIAMCINOLONE ACETONIDE 40 MG/ML IJ SUSP
40.0000 mg | Freq: Once | INTRAMUSCULAR | Status: AC
Start: 2020-11-09 — End: 2020-11-09
  Administered 2020-11-09: 40 mg

## 2020-11-09 MED ORDER — EPHEDRINE SULFATE 50 MG/ML IJ SOLN
INTRAMUSCULAR | Status: AC
  Filled 2020-11-09: qty 1

## 2020-11-09 MED ORDER — ROPIVACAINE HCL 2 MG/ML IJ SOLN
INTRAMUSCULAR | Status: AC
  Filled 2020-11-09: qty 20

## 2020-11-09 NOTE — Progress Notes (Signed)
Safety precautions to be maintained throughout the outpatient stay will include: orient to surroundings, keep bed in low position, maintain call bell within reach at all times, provide assistance with transfer out of bed and ambulation.  

## 2020-11-09 NOTE — Patient Instructions (Addendum)
____________________________________________________________________________________________  Post-Procedure Discharge Instructions  Instructions:  Apply ice:   Purpose: This will minimize any swelling and discomfort after procedure.   When: Day of procedure, as soon as you get home.  How: Fill a plastic sandwich bag with crushed ice. Cover it with a small towel and apply to injection site.  How long: (15 min on, 15 min off) Apply for 15 minutes then remove x 15 minutes.  Repeat sequence on day of procedure, until you go to bed.  Apply heat:   Purpose: To treat any soreness and discomfort from the procedure.  When: Starting the next day after the procedure.  How: Apply heat to procedure site starting the day following the procedure.  How long: May continue to repeat daily, until discomfort goes away.  Food intake: Start with clear liquids (like water) and advance to regular food, as tolerated.   Physical activities: Keep activities to a minimum for the first 8 hours after the procedure. After that, then as tolerated.  Driving: If you have received any sedation, be responsible and do not drive. You are not allowed to drive for 24 hours after having sedation.  Blood thinner: (Applies only to those taking blood thinners) You may restart your blood thinner 6 hours after your procedure.  Insulin: (Applies only to Diabetic patients taking insulin) As soon as you can eat, you may resume your normal dosing schedule.  Infection prevention: Keep procedure site clean and dry. Shower daily and clean area with soap and water.  Post-procedure Pain Diary: Extremely important that this be done correctly and accurately. Recorded information will be used to determine the next step in treatment. For the purpose of accuracy, follow these rules:  Evaluate only the area treated. Do not report or include pain from an untreated area. For the purpose of this evaluation, ignore all other areas of pain,  except for the treated area.  After your procedure, avoid taking a long nap and attempting to complete the pain diary after you wake up. Instead, set your alarm clock to go off every hour, on the hour, for the initial 8 hours after the procedure. Document the duration of the numbing medicine, and the relief you are getting from it.  Do not go to sleep and attempt to complete it later. It will not be accurate. If you received sedation, it is likely that you were given a medication that may cause amnesia. Because of this, completing the diary at a later time may cause the information to be inaccurate. This information is needed to plan your care.  Follow-up appointment: Keep your post-procedure follow-up evaluation appointment after the procedure (usually 2 weeks for most procedures, 6 weeks for radiofrequencies). DO NOT FORGET to bring you pain diary with you.   Expect: (What should I expect to see with my procedure?)  From numbing medicine (AKA: Local Anesthetics): Numbness or decrease in pain. You may also experience some weakness, which if present, could last for the duration of the local anesthetic.  Onset: Full effect within 15 minutes of injected.  Duration: It will depend on the type of local anesthetic used. On the average, 1 to 8 hours.   From steroids (Applies only if steroids were used): Decrease in swelling or inflammation. Once inflammation is improved, relief of the pain will follow.  Onset of benefits: Depends on the amount of swelling present. The more swelling, the longer it will take for the benefits to be seen. In some cases, up to 10 days.    Duration: Steroids will stay in the system x 2 weeks. Duration of benefits will depend on multiple posibilities including persistent irritating factors.  Side-effects: If present, they may typically last 2 weeks (the duration of the steroids).  Frequent: Cramps (if they occur, drink Gatorade and take over-the-counter Magnesium 450-500 mg  once to twice a day); water retention with temporary weight gain; increases in blood sugar; decreased immune system response; increased appetite.  Occasional: Facial flushing (red, warm cheeks); mood swings; menstrual changes.  Uncommon: Long-term decrease or suppression of natural hormones; bone thinning. (These are more common with higher doses or more frequent use. This is why we prefer that our patients avoid having any injection therapies in other practices.)   Very Rare: Severe mood changes; psychosis; aseptic necrosis.  From procedure: Some discomfort is to be expected once the numbing medicine wears off. This should be minimal if ice and heat are applied as instructed.  Call if: (When should I call?)  You experience numbness and weakness that gets worse with time, as opposed to wearing off.  New onset bowel or bladder incontinence. (Applies only to procedures done in the spine)  Emergency Numbers:  Durning business hours (Monday - Thursday, 8:00 AM - 4:00 PM) (Friday, 9:00 AM - 12:00 Noon): [phone]  After hours: [phone]  NOTE: If you are having a problem and are unable connect with, or to talk to a provider, then go to your nearest urgent care or emergency department. If the problem is serious and urgent, please call 911. ____________________________________________________________________________________________   ____________________________________________________________________________________________  Medication Rules  Purpose: To inform patients, and their family members, of our rules and regulations.  Applies to: All patients receiving prescriptions (written or electronic).  Pharmacy of record: Pharmacy where electronic prescriptions will be sent. If written prescriptions are taken to a different pharmacy, please inform the nursing staff. The pharmacy listed in the electronic medical record should be the one where you would like electronic prescriptions  to be sent.  Electronic prescriptions: In compliance with the [place] Strengthen Opioid Misuse Prevention (STOP) Act of 2017 (Session Law 2017-74/H243), effective June 05, 2018, all controlled substances must be electronically prescribed. Calling prescriptions to the pharmacy will cease to exist.  Prescription refills: Only during scheduled appointments. Applies to all prescriptions.  NOTE: The following applies primarily to controlled substances (Opioid* Pain Medications).   Type of encounter (visit): For patients receiving controlled substances, face-to-face visits are required. (Not an option or up to the patient.)  Patient's responsibilities: 1. Pain Pills: Bring all pain pills to every appointment (except for procedure appointments). 2. Pill Bottles: Bring pills in original pharmacy bottle. Always bring the newest bottle. Bring bottle, even if empty. 3. Medication refills: You are responsible for knowing and keeping track of what medications you take and those you need refilled. The day before your appointment: write a list of all prescriptions that need to be refilled. The day of the appointment: give the list to the admitting nurse. Prescriptions will be written only during appointments. No prescriptions will be written on procedure days. If you forget a medication: it will not be "Called in", "Faxed", or "electronically sent". You will need to get another appointment to get these prescribed. No early refills. Do not call asking to have your prescription filled early. 4. Prescription Accuracy: You are responsible for carefully inspecting your prescriptions before leaving our office. Have the discharge nurse carefully go over each prescription with you, before taking them home. Make sure that   your name is accurately spelled, that your address is correct. Check the name and dose of your medication to make sure it is accurate. Check the number of pills, and the written instructions to  make sure they are clear and accurate. Make sure that you are given enough medication to last until your next medication refill appointment. 5. Taking Medication: Take medication as prescribed. When it comes to controlled substances, taking less pills or less frequently than prescribed is permitted and encouraged. Never take more pills than instructed. Never take medication more frequently than prescribed.  6. Inform other Doctors: Always inform, all of your healthcare providers, of all the medications you take. 7. Pain Medication from other Providers: You are not allowed to accept any additional pain medication from any other Doctor or Healthcare provider. There are two exceptions to this rule. (see below) In the event that you require additional pain medication, you are responsible for notifying us, as stated below. 8. Cough Medicine: Often these contain an opioid, such as codeine or hydrocodone. Never accept or take cough medicine containing these opioids if you are already taking an opioid* medication. The combination may cause respiratory failure and death. 9. Medication Agreement: You are responsible for carefully reading and following our Medication Agreement. This must be signed before receiving any prescriptions from our practice. Safely store a copy of your signed Agreement. Violations to the Agreement will result in no further prescriptions. (Additional copies of our Medication Agreement are available upon request.) 10. Laws, Rules, & Regulations: All patients are expected to follow all [address] and [employer], Rules, [place] [employer]. Ignorance of the Laws does not constitute a valid excuse.  11. Illegal drugs and Controlled Substances: The use of illegal substances (including, but not limited to marijuana and its derivatives) and/or the illegal use of any controlled substances is strictly prohibited. Violation of this rule may result in the immediate and permanent discontinuation of any  and all prescriptions being written by our practice. The use of any illegal substances is prohibited. 12. Adopted CDC guidelines & recommendations: Target dosing levels will be at or below 60 MME/day. Use of benzodiazepines** is not recommended.  Exceptions: There are only two exceptions to the rule of not receiving pain medications from other Healthcare Providers. 1. Exception #1 (Emergencies): In the event of an emergency (i.e.: accident requiring emergency care), you are allowed to receive additional pain medication. However, you are responsible for: As soon as you are able, call our office [phone], at any time of the day or night, and leave a message stating your name, the date and nature of the emergency, and the name and dose of the medication prescribed. In the event that your call is answered by a member of our staff, make sure to document and save the date, time, and the name of the person that took your information.  2. Exception #2 (Planned Surgery): In the event that you are scheduled by another doctor or dentist to have any type of surgery or procedure, you are allowed (for a period no longer than 30 days), to receive additional pain medication, for the acute post-op pain. However, in this case, you are responsible for picking up a copy of our "Post-op Pain Management for Surgeons" handout, and giving it to your surgeon or dentist. This document is available at our office, and does not require an appointment to obtain it. Simply go to our office during business hours (Monday-Thursday from 8:00 AM to 4:00 PM) (Friday 8:00  AM to 12:00 Noon) or if you have a scheduled appointment with us, prior to your surgery, and ask for it by name. In addition, you are responsible for: calling our office [phone], at any time of the day or night, and leaving a message stating your name, name of your surgeon, type of surgery, and date of procedure or surgery. Failure to comply with your responsibilities  may result in termination of therapy involving the controlled substances.  *Opioid medications include: morphine, codeine, oxycodone, oxymorphone, hydrocodone, hydromorphone, meperidine, tramadol, tapentadol, buprenorphine, fentanyl, methadone. **Benzodiazepine medications include: diazepam (Valium), alprazolam (Xanax), clonazepam (Klonopine), lorazepam (Ativan), clorazepate (Tranxene), chlordiazepoxide (Librium), estazolam (Prosom), oxazepam (Serax), temazepam (Restoril), triazolam (Halcion) (Last updated: 05/03/2020) ____________________________________________________________________________________________   ____________________________________________________________________________________________  Medication Recommendations and Reminders  Applies to: All patients receiving prescriptions (written and/or electronic).  Medication Rules & Regulations: These rules and regulations exist for your safety and that of others. They are not flexible and neither are we. Dismissing or ignoring them will be considered "non-compliance" with medication therapy, resulting in complete and irreversible termination of such therapy. (See document titled "Medication Rules" for more details.) In all conscience, because of safety reasons, we cannot continue providing a therapy where the patient does not follow instructions.  Pharmacy of record:   Definition: This is the pharmacy where your electronic prescriptions will be sent.   We do not endorse any particular pharmacy, however, we have experienced problems with [facility] not securing enough medication supply for the community.  We do not restrict you in your choice of pharmacy. However, once we write for your prescriptions, we will NOT be re-sending more prescriptions to fix restricted supply problems created by your pharmacy, or your insurance.   The pharmacy listed in the electronic medical record should be the one where you want electronic prescriptions  to be sent.  If you choose to change pharmacy, simply notify our nursing staff.  Recommendations:  Keep all of your pain medications in a safe place, under lock and key, even if you live alone. We will NOT replace lost, stolen, or damaged medication.  After you fill your prescription, take 1 week's worth of pills and put them away in a safe place. You should keep a separate, properly labeled bottle for this purpose. The remainder should be kept in the original bottle. Use this as your primary supply, until it runs out. Once it's gone, then you know that you have 1 week's worth of medicine, and it is time to come in for a prescription refill. If you do this correctly, it is unlikely that you will ever run out of medicine.  To make sure that the above recommendation works, it is very important that you make sure your medication refill appointments are scheduled at least 1 week before you run out of medicine. To do this in an effective manner, make sure that you do not leave the office without scheduling your next medication management appointment. Always ask the nursing staff to show you in your prescription , when your medication will be running out. Then arrange for the receptionist to get you a return appointment, at least 7 days before you run out of medicine. Do not wait until you have 1 or 2 pills left, to come in. This is very poor planning and does not take into consideration that we may need to cancel appointments due to bad weather, sickness, or emergencies affecting our staff.  DO NOT ACCEPT A "Partial Fill": If for any reason your pharmacy  does not have enough pills/tablets to completely fill or refill your prescription, do not allow for a "partial fill". The law allows the pharmacy to complete that prescription within 72 hours, without requiring a new prescription. If they do not fill the rest of your prescription within those 72 hours, you will need a separate prescription to fill the  remaining amount, which we will NOT provide. If the reason for the partial fill is your insurance, you will need to talk to the pharmacist about payment alternatives for the remaining tablets, but again, DO NOT ACCEPT A PARTIAL FILL, unless you can trust your pharmacist to obtain the remainder of the pills within 72 hours.  Prescription refills and/or changes in medication(s):   Prescription refills, and/or changes in dose or medication, will be conducted only during scheduled medication management appointments. (Applies to both, written and electronic prescriptions.)  No refills on procedure days. No medication will be changed or started on procedure days. No changes, adjustments, and/or refills will be conducted on a procedure day. Doing so will interfere with the diagnostic portion of the procedure.  No phone refills. No medications will be "called into the pharmacy".  No Fax refills.  No weekend refills.  No Holliday refills.  No after hours refills.  Remember:  Business hours are:  Monday to Thursday 8:00 AM to 4:00 PM Provider's Schedule: Delano Metz, MD - Appointments are:  Medication management: Monday and Wednesday 8:00 AM to 4:00 PM Procedure day: Tuesday and Thursday 7:30 AM to 4:00 PM Edward Jolly, MD - Appointments are:  Medication management: Tuesday and Thursday 8:00 AM to 4:00 PM Procedure day: Monday and Wednesday 7:30 AM to 4:00 PM (Last update: 12/24/2019) ____________________________________________________________________________________________   ____________________________________________________________________________________________  CBD (cannabidiol) WARNING  Applicable to: All individuals currently taking or considering taking CBD (cannabidiol) and, more important, all patients taking opioid analgesic controlled substances (pain medication). (Example: oxycodone; oxymorphone; hydrocodone; hydromorphone; morphine; methadone; tramadol; tapentadol;  fentanyl; buprenorphine; butorphanol; dextromethorphan; meperidine; codeine; etc.)  Legal status: CBD remains a Schedule I drug prohibited for any use. CBD is illegal with one exception. In the [place], CBD has a limited Education officer, environmental (FDA) approval for the treatment of two specific types of epilepsy disorders. Only one CBD product has been approved by the FDA for this purpose: "Epidiolex". FDA is aware that some companies are marketing products containing cannabis and cannabis-derived compounds in ways that violate the [employer], Drug and Cosmetic Act [facility] Act) and that may put the health and safety of consumers at risk. The FDA, a Federal agency, has not enforced the CBD status since 2018.   Legality: Some manufacturers ship CBD products nationally, which is illegal. Often such products are sold online and are therefore available throughout the country. CBD is openly sold in head shops and health food stores in some states where such sales have not been explicitly legalized. Selling unapproved products with unsubstantiated therapeutic claims is not only a violation of the law, but also can put patients at risk, as these products have not been proven to be safe or effective. Federal illegality makes it difficult to conduct research on CBD.  Reference: "FDA Regulation of Cannabis and Cannabis-Derived Products, Including Cannabidiol (CBD)" - [employer]  Warning: CBD is not FDA approved and has not undergo the same manufacturing controls as prescription drugs.  This means that the purity and safety of available CBD may be questionable. Most of the time, despite manufacturer's claims, it is contaminated with THC (delta-9-tetrahydrocannabinol -  the chemical in marijuana responsible for the "HIGH").  When this is the case, the [facility] contaminant will trigger a positive  urine drug screen (UDS) test for Marijuana (carboxy-THC). Because a positive UDS for any illicit substance is a violation of our medication agreement, your opioid analgesics (pain medicine) may be permanently discontinued.  MORE ABOUT CBD  General Information: CBD  is a derivative of the Marijuana (cannabis sativa) plant discovered in 27. It is one of the 113 identified substances found in Marijuana. It accounts for up to 40% of the plant's extract. As of 2018, preliminary clinical studies on CBD included research for the treatment of anxiety, movement disorders, and pain. CBD is available and consumed in multiple forms, including inhalation of smoke or vapor, as an aerosol spray, and by mouth. It may be supplied as an oil containing CBD, capsules, dried cannabis, or as a liquid solution. CBD is thought not to be as psychoactive as THC (delta-9-tetrahydrocannabinol - the chemical in marijuana responsible for the "HIGH"). Studies suggest that CBD may interact with different biological target receptors in the body, including cannabinoid and other neurotransmitter receptors. As of 2018 the mechanism of action for its biological effects has not been determined.  Side-effects  Adverse reactions: Dry mouth, diarrhea, decreased appetite, fatigue, drowsiness, malaise, weakness, sleep disturbances, and others.  Drug interactions: CBC may interact with other medications such as blood-thinners. (Last update: 01/10/2020) ____________________________________________________________________________________________   ____________________________________________________________________________________________  Drug Holidays (Slow)  What is a "Drug Holiday"? Drug Holiday: is the name given to the period of time during which a patient stops taking a medication(s) for the purpose of eliminating tolerance to the drug.  Benefits . Improved effectiveness of opioids. . Decreased opioid dose needed to achieve  benefits. . Improved pain with lesser dose.  What is tolerance? Tolerance: is the progressive decreased in effectiveness of a drug due to its repetitive use. With repetitive use, the body gets use to the medication and as a consequence, it loses its effectiveness. This is a common problem seen with opioid pain medications. As a result, a larger dose of the drug is needed to achieve the same effect that used to be obtained with a smaller dose.  How long should a "Drug Holiday" last? You should stay off of the pain medicine for at least 14 consecutive days. (2 weeks)  Should I stop the medicine "cold [place]"? No. You should always coordinate with your Pain Specialist so that he/she can provide you with the correct medication dose to make the transition as smoothly as possible.  How do I stop the medicine? Slowly. You will be instructed to decrease the daily amount of pills that you take by one (1) pill every seven (7) days. This is called a "slow downward taper" of your dose. For example: if you normally take four (4) pills per day, you will be asked to drop this dose to three (3) pills per day for seven (7) days, then to two (2) pills per day for seven (7) days, then to one (1) per day for seven (7) days, and at the end of those last seven (7) days, this is when the "Drug Holiday" would start.   Will I have withdrawals? By doing a "slow downward taper" like this one, it is unlikely that you will experience any significant withdrawal symptoms. Typically, what triggers withdrawals is the sudden stop of a high dose opioid therapy. Withdrawals can usually be avoided by slowly decreasing the dose over a prolonged period of  time. If you do not follow these instructions and decide to stop your medication abruptly, withdrawals may be possible.  What are withdrawals? Withdrawals: refers to the wide range of symptoms that occur after stopping or dramatically reducing opiate drugs after heavy and prolonged  use. Withdrawal symptoms do not occur to patients that use low dose opioids, or those who take the medication sporadically. Contrary to benzodiazepine (example: Valium, Xanax, etc.) or alcohol withdrawals ("Delirium Tremens"), opioid withdrawals are not lethal. Withdrawals are the physical manifestation of the body getting rid of the excess receptors.  Expected Symptoms Early symptoms of withdrawal may include: . Agitation . Anxiety . Muscle aches . Increased tearing . Insomnia . Runny nose . Sweating . Yawning  Late symptoms of withdrawal may include: . Abdominal cramping . Diarrhea . Dilated pupils . Goose bumps . Nausea . Vomiting  Will I experience withdrawals? Due to the slow nature of the taper, it is very unlikely that you will experience any.  What is a slow taper? Taper: refers to the gradual decrease in dose.  (Last update: 12/24/2019) ____________________________________________________________________________________________     ______________________________________________________________________________________________  Specialty Pain Scale  Introduction:  There are significant differences in how pain is reported. The word pain usually refers to physical pain, but it is also a common synonym of suffering. The medical community uses a scale from 0 (zero) to 10 (ten) to report pain level. Zero (0) is described as "no pain", while ten (10) is described as "the worse pain you can imagine". The problem with this scale is that physical pain is reported along with suffering. Suffering refers to mental pain, or more often yet it refers to any unpleasant feeling, emotion or aversion associated with the perception of harm or threat of harm. It is the psychological component of pain.  Pain Specialists prefer to separate the two components. The pain scale used by this practice is the Verbal Numerical Rating Scale (VNRS-11). This scale is for the physical pain only. DO NOT  INCLUDE how your pain psychologically affects you. This scale is for adults 58 years of age and older. It has 11 (eleven) levels. The 1st level is 0/10. This means: "right now, I have no pain". In the context of pain management, it also means: "right now, my physical pain is under control with the current therapy".  General Information:  The scale should reflect your current level of pain. Unless you are specifically asked for the level of your worst pain, or your average pain. If you are asked for one of these two, then it should be understood that it is over the past 24 hours.  Levels 1 (one) through 5 (five) are described below, and can be treated as an outpatient. Ambulatory pain management facilities such as ours are more than adequate to treat these levels. Levels 6 (six) through 10 (ten) are also described below, however, these must be treated as a hospitalized patient. While levels 6 (six) and 7 (seven) may be evaluated at an urgent care facility, levels 8 (eight) through 10 (ten) constitute medical emergencies and as such, they belong in a hospital's emergency department. When having these levels (as described below), do not come to our office. Our facility is not equipped to manage these levels. Go directly to an urgent care facility or an emergency department to be evaluated.  Definitions:  Activities of Daily Living (ADL): Activities of daily living (ADL or ADLs) is a term used in healthcare to refer to people's daily self-care activities.  Health professionals often use a person's ability or inability to perform ADLs as a measurement of their functional status, particularly in regard to people post injury, with disabilities and the elderly. There are two ADL levels: Basic and Instrumental. Basic Activities of Daily Living (BADL  or BADLs) consist of self-care tasks that include: Bathing and showering; personal hygiene and grooming (including brushing/combing/styling hair); dressing; Toilet  hygiene (getting to the toilet, cleaning oneself, and getting back up); eating and self-feeding (not including cooking or chewing and swallowing); functional mobility, often referred to as "transferring", as measured by the ability to walk, get in and out of bed, and get into and out of a chair; the broader definition (moving from one place to another while performing activities) is useful for people with different physical abilities who are still able to get around independently. Basic ADLs include the things many people do when they get up in the morning and get ready to go out of the house: get out of bed, go to the toilet, bathe, dress, groom, and eat. On the average, loss of function typically follows a particular order. Hygiene is the first to go, followed by loss of toilet use and locomotion. The last to go is the ability to eat. When there is only one remaining area in which the person is independent, there is a 62.9% chance that it is eating and only a 3.5% chance that it is hygiene. Instrumental Activities of Daily Living (IADL or IADLs) are not necessary for fundamental functioning, but they let an individual live independently in a community. IADL consist of tasks that include: cleaning and maintaining the house; home establishment and maintenance; care of others (including selecting and supervising caregivers); care of pets; child rearing; managing money; managing financials (investments, etc.); meal preparation and cleanup; shopping for groceries and necessities; moving within the community; safety procedures and emergency responses; health management and maintenance (taking prescribed medications); and using the telephone or other form of communication.  Instructions:  Most patients tend to report their pain as a combination of two factors, their physical pain and their psychosocial pain. This last one is also known as "suffering" and it is reflection of how physical pain affects you socially and  psychologically. From now on, report them separately.  From this point on, when asked to report your pain level, report only your physical pain. Use the following table for reference.  Pain Clinic Pain Levels (0-5/10)  Pain Level Score  Description  No Pain 0   Mild pain 1 Nagging, annoying, but does not interfere with basic activities of daily living (ADL). Patients are able to eat, bathe, get dressed, toileting (being able to get on and off the toilet and perform personal hygiene functions), transfer (move in and out of bed or a chair without assistance), and maintain continence (able to control bladder and bowel functions). Blood pressure and heart rate are unaffected. A normal heart rate for a healthy adult ranges from 60 to 100 bpm (beats per minute).   Mild to moderate pain 2 Noticeable and distracting. Impossible to hide from other people. More frequent flare-ups. Still possible to adapt and function close to normal. It can be very annoying and may have occasional stronger flare-ups. With discipline, patients may get used to it and adapt.   Moderate pain 3 Interferes significantly with activities of daily living (ADL). It becomes difficult to feed, bathe, get dressed, get on and off the toilet or to perform personal hygiene functions. Difficult to get  in and out of bed or a chair without assistance. Very distracting. With effort, it can be ignored when deeply involved in activities.   Moderately severe pain 4 Impossible to ignore for more than a few minutes. With effort, patients may still be able to manage work or participate in some social activities. Very difficult to concentrate. Signs of autonomic nervous system discharge are evident: dilated pupils (mydriasis); mild sweating (diaphoresis); sleep interference. Heart rate becomes elevated (>115 bpm). Diastolic blood pressure (lower number) rises above 100 mmHg. Patients find relief in laying down and not moving.   Severe pain 5 Intense and  extremely unpleasant. Associated with frowning face and frequent crying. Pain overwhelms the senses.  Ability to do any activity or maintain social relationships becomes significantly limited. Conversation becomes difficult. Pacing back and forth is common, as getting into a comfortable position is nearly impossible. Pain wakes you up from deep sleep. Physical signs will be obvious: pupillary dilation; increased sweating; goosebumps; brisk reflexes; cold, clammy hands and feet; nausea, vomiting or dry heaves; loss of appetite; significant sleep disturbance with inability to fall asleep or to remain asleep. When persistent, significant weight loss is observed due to the complete loss of appetite and sleep deprivation.  Blood pressure and heart rate becomes significantly elevated. Caution: If elevated blood pressure triggers a pounding headache associated with blurred vision, then the patient should immediately seek attention at an urgent or emergency care unit, as these may be signs of an impending stroke.    Emergency Department Pain Levels (6-10/10)  Emergency Room Pain 6 Severely limiting. Requires emergency care and should not be seen or managed at an outpatient pain management facility. Communication becomes difficult and requires great effort. Assistance to reach the emergency department may be required. Facial flushing and profuse sweating along with potentially dangerous increases in heart rate and blood pressure will be evident.   Distressing pain 7 Self-care is very difficult. Assistance is required to transport, or use restroom. Assistance to reach the emergency department will be required. Tasks requiring coordination, such as bathing and getting dressed become very difficult.   Disabling pain 8 Self-care is no longer possible. At this level, pain is disabling. The individual is unable to do even the most "basic" activities such as walking, eating, bathing, dressing, transferring to a bed, or  toileting. Fine motor skills are lost. It is difficult to think clearly.   Incapacitating pain 9 Pain becomes incapacitating. Thought processing is no longer possible. Difficult to remember your own name. Control of movement and coordination are lost.   The worst pain imaginable 10 At this level, most patients pass out from pain. When this level is reached, collapse of the autonomic nervous system occurs, leading to a sudden drop in blood pressure and heart rate. This in turn results in a temporary and dramatic drop in blood flow to the brain, leading to a loss of consciousness. Fainting is one of the body's self defense mechanisms. Passing out puts the brain in a calmed state and causes it to shut down for a while, in order to begin the healing process.    Summary: 1.   Refer to this scale when providing [place] with your pain level. 2.   Be accurate and careful when reporting your pain level. This will help with your care. 3.   Over-reporting your pain level will lead to loss of credibility. 4.   Even a level of 1/10 means that there is pain and will be treated at  our facility. 5.   High, inaccurate reporting will be documented as "Symptom Exaggeration", leading to loss of credibility and suspicions of possible secondary gains such as obtaining more narcotics, or wanting to appear disabled, for fraudulent reasons. 6.   Only pain levels of 5 or below will be seen at our facility. 7.   Pain levels of 6 and above will be sent to the Emergency Department and the appointment cancelled.  ______________________________________________________________________________________________  ____________________________________________________________________________________________  Vasovagal Reflex  What is a vasovagal Reflex? It is the most common cause of fainting. It is commonly seen by physicians that specialize in spinal procedures. It is usually not harmful nor a sign of a more serious problem.  Many  nerves connect with your heart and blood vessels. These nerves help control the speed and force of your heartbeat. They also regulate blood pressure. Usually, these nerves coordinate their actions so you always get enough blood to your brain. Under certain situations, these nerves can send an inappropriate signal. This can cause your heart rate and blood pressure to go down. This in turn can lead to decrease in blood flow to the brain causing you to lose consciousness. This brief loss of consciousness is known as a "syncope" or "fainting".  Vasovagal syncope is quite common. It most often affects children and young adults, but it can happen at any age. It happens to men and women in about equal numbers. Unlike some other causes of fainting, vasovagal syncope does not signal an underlying problem with the heart or brain.  What causes vasovagal syncope? Several triggers can cause vasovagal syncope: standing for long periods; excess heat; intense emotion, such as fear; intense pain; the sight of blood or a needle; prolonged exertion; dehydration; or skipping meals.  Older adults may have other specific triggers, for example: urinating; swallowing; coughing; or having a bowel movement.  Triggers associated to medical interventions: procedures close to the spine (i.e.: spinal or epidural injections; nerve blocks close to the spine; spinal manipulation; etc.)  What are the symptoms? Fainting is the defining symptom of vasovagal syncope. Often, you may have certain symptoms before actually fainting such as: nausea; warmth; turning pale; getting sweaty palms; feeling dizzy or lightheaded; blurred vision; or a feeling of impending doom.  How is a vasovagal reflex diagnosed? Always let your physician and staff know if you have a history of "fainting" or "passing out". During procedures, your vital signs (blood pressure, heart rate, and oxygen saturation) will be monitored. Your physician will be watching for  the warning signs of vasovagal syncope, like dizziness, nausea, or sweaty palms. If you begin to feel dizzy or lightheaded, immediately notify your healthcare provider. During medical procedures it is easily diagnosed using the EKG and blood pressure monitor. These will show a decrease in heart rate followed by a similar decrease in blood pressure. Clinically, the patient will indicate feeling dizzy and lightheaded.  How is it treated? The bed will be tilted so as to have your head lower than your body. This will help send more blood flow to the brain. In addition, the procedure may be cut short to temporarily stop stimulation. You may also be given medications to speed up the heart rate and increase the blood pressure. Hospital protocols also call for patients to be observed for 15 to 30 minutes.  Do I need to do anything? This reflex does not affect everyone. However, those who have a history of "passing out" or "fainting", tend to experience these episodes more frequently.  If you have a history of "passing out" or "fainting", let your physician know, since your doctor may have some suggestions on how to help prevent it.  ____________________________________________________________________________________________

## 2020-11-09 NOTE — Progress Notes (Signed)
PROVIDER NOTE: Information contained herein reflects review and annotations entered in association with encounter. Interpretation of such information and data should be left to medically-trained personnel. Information provided to patient can be located elsewhere in the medical record under "Patient Instructions". Document created using STT-dictation technology, any transcriptional errors that may result from process are unintentional.    Patient: Sandy Mitchell  Service Category: Procedure  Provider: Oswaldo DoneFrancisco A Johnetta Sloniker, MD  DOB: 06/07/1970  DOS: 11/09/2020  Location: [facility]  MRN: [id number]  Setting: Ambulatory - outpatient  Referring Provider: No ref. provider found  Type: Established Patient  Specialty: Interventional Pain Management  PCP: Patient, No Pcp Per (Inactive)   Primary Reason for Visit: Interventional Pain Management Treatment. CC: Back Pain  Procedure:          Anesthesia, Analgesia, Anxiolysis:  Type: Therapeutic Inter-Laminar Epidural Steroid Injection  #1  Region: Lumbar Level: L4-5 Level. Laterality: Right Paramedial  Type: Local Anesthesia Indication(s): Analgesia         Route: Infiltration ([place]/IM) IV Access: Declined Sedation: Declined  Local Anesthetic: Lidocaine 1-2%  Position: Prone with head of the table was raised to facilitate breathing.   Indications: 1. Chronic radicular pain of lower extremity (S1 dermatome) (Bilateral) (R>L)   2. DDD (degenerative disc disease), lumbosacral   3. Lumbar central spinal stenosis (L4-5) w/o neurogenic claudication   4. Lumbar foraminal stenosis (L4-5) (Bilateral)   5. Lumbar radiculopathy   6. Chronic low back pain (1ry area of Pain) (Bilateral) (R>L) w/ sciatica (Right)   7. Chronic lower extremity pain (2ry area of Pain) (Bilateral) (R>L)    Pain Score: Pre-procedure: 3 /10 Post-procedure: 0-No pain/10   Pre-op H&P Assessment:  [name] is a 50 y.o. (year old), female patient, seen today for  interventional treatment. She  has a past surgical history that includes Tubal ligation. [name] has a current medication list which includes the following prescription(s): diclofenac, fluorometholone, levothyroxine, modafinil, omeprazole, ondansetron, topiramate, and tramadol. Her primarily concern today is the Back Pain  Initial Vital Signs:  Pulse/HCG Rate: 85ECG Heart Rate: 82 Temp: (!) 97.2 F (36.2 C) Resp: 16 BP: 122/85 SpO2: 100 %  BMI: Estimated body mass index is 32.92 kg/m as calculated from the following:   Height as of this encounter: 5\' 2"  (1.575 m).   Weight as of this encounter: 180 lb (81.6 kg).  Risk Assessment: Allergies: Reviewed. She is allergic to amlodipine, melatonin, phenergan [promethazine hcl], trazodone and nefazodone, atenolol, and nystatin.  Allergy Precautions: None required Coagulopathies: Reviewed. None identified.  Blood-thinner therapy: None at this time Active Infection(s): Reviewed. None identified. [name] is afebrile  Site Confirmation: [name] was asked to confirm the procedure and laterality before marking the site Procedure checklist: Completed Consent: Before the procedure and under the influence of no sedative(s), amnesic(s), or anxiolytics, the patient was informed of the treatment options, risks and possible complications. To fulfill our ethical and legal obligations, as recommended by the American Medical Association's Code of Ethics, I have informed the patient of my clinical impression; the nature and purpose of the treatment or procedure; the risks, benefits, and possible complications of the intervention; the alternatives, including doing nothing; the risk(s) and benefit(s) of the alternative treatment(s) or procedure(s); and the risk(s) and benefit(s) of doing nothing. The patient was provided information about the general risks and possible complications associated with the procedure. These may include, but are not  limited to: failure to achieve desired goals, infection, bleeding, organ or  nerve damage, allergic reactions, paralysis, and death. In addition, the patient was informed of those risks and complications associated to Spine-related procedures, such as failure to decrease pain; infection (i.e.: Meningitis, epidural or intraspinal abscess); bleeding (i.e.: epidural hematoma, subarachnoid hemorrhage, or any other type of intraspinal or peri-dural bleeding); organ or nerve damage (i.e.: Any type of peripheral nerve, nerve root, or spinal cord injury) with subsequent damage to sensory, motor, and/or autonomic systems, resulting in permanent pain, numbness, and/or weakness of one or several areas of the body; allergic reactions; (i.e.: anaphylactic reaction); and/or death. Furthermore, the patient was informed of those risks and complications associated with the medications. These include, but are not limited to: allergic reactions (i.e.: anaphylactic or anaphylactoid reaction(s)); adrenal axis suppression; blood sugar elevation that in diabetics may result in ketoacidosis or comma; water retention that in patients with history of congestive heart failure may result in shortness of breath, pulmonary edema, and decompensation with resultant heart failure; weight gain; swelling or edema; medication-induced neural toxicity; particulate matter embolism and blood vessel occlusion with resultant organ, and/or nervous system infarction; and/or aseptic necrosis of one or more joints. Finally, the patient was informed that Medicine is not an exact science; therefore, there is also the possibility of unforeseen or unpredictable risks and/or possible complications that may result in a catastrophic outcome. The patient indicated having understood very clearly. We have given the patient no guarantees and we have made no promises. Enough time was given to the patient to ask questions, all of which were answered to the patient's  satisfaction. Sandy Mitchell has indicated that she wanted to continue with the procedure. Attestation: I, the ordering provider, attest that I have discussed with the patient the benefits, risks, side-effects, alternatives, likelihood of achieving goals, and potential problems during recovery for the procedure that I have provided informed consent. Date  Time: 11/09/2020  8:06 AM  Pre-Procedure Preparation:  Monitoring: As per clinic protocol. Respiration, ETCO2, SpO2, BP, heart rate and rhythm monitor placed and checked for adequate function Safety Precautions: Patient was assessed for positional comfort and pressure points before starting the procedure. Time-out: I initiated and conducted the "Time-out" before starting the procedure, as per protocol. The patient was asked to participate by confirming the accuracy of the "Time Out" information. Verification of the correct person, site, and procedure were performed and confirmed by me, the nursing staff, and the patient. "Time-out" conducted as per Joint Commission's Universal Protocol (UP.01.01.01). Time: 5427  Description of Procedure:          Target Area: The interlaminar space, initially targeting the lower laminar border of the superior vertebral body. Approach: Paramedial approach. Area Prepped: Entire Posterior Lumbar Region DuraPrep (Iodine Povacrylex [0.7% available iodine] and Isopropyl Alcohol, 74% w/w) Safety Precautions: Aspiration looking for blood return was conducted prior to all injections. At no point did we inject any substances, as a needle was being advanced. No attempts were made at seeking any paresthesias. Safe injection practices and needle disposal techniques used. Medications properly checked for expiration dates. SDV (single dose vial) medications used. Description of the Procedure: Protocol guidelines were followed. The procedure needle was introduced through the skin, ipsilateral to the reported pain, and advanced to the  target area. Bone was contacted and the needle walked caudad, until the lamina was cleared. The epidural space was identified using "loss-of-resistance technique" with 2-3 ml of PF-NaCl (0.9% NSS), in a 5cc LOR glass syringe.  Vitals:   11/09/20 0900 11/09/20 0902 11/09/20 0908 11/09/20 0623  BP: (!) 101/50 120/81 121/69 122/74  Pulse:      Resp: 19 (!) 25 12 13   Temp:   (!) 97.2 F (36.2 C)   SpO2: 96% 96% 100% 100%  Weight:      Height:        Start Time: 0850 hrs. End Time: 0900 hrs.  Materials:  Needle(s) Type: Epidural needle Gauge: 17G Length: 3.5-in Medication(s): Please see orders for medications and dosing details.  Imaging Guidance (Spinal):          Type of Imaging Technique: Fluoroscopy Guidance (Spinal) Indication(s): Assistance in needle guidance and placement for procedures requiring needle placement in or near specific anatomical locations not easily accessible without such assistance. Exposure Time: Please see nurses notes. Contrast: Before injecting any contrast, we confirmed that the patient did not have an allergy to iodine, shellfish, or radiological contrast. Once satisfactory needle placement was completed at the desired level, radiological contrast was injected. Contrast injected under live fluoroscopy. No contrast complications. See chart for type and volume of contrast used. Fluoroscopic Guidance: I was personally present during the use of fluoroscopy. "Tunnel Vision Technique" used to obtain the best possible view of the target area. Parallax error corrected before commencing the procedure. "Direction-depth-direction" technique used to introduce the needle under continuous pulsed fluoroscopy. Once target was reached, antero-posterior, oblique, and lateral fluoroscopic projection used confirm needle placement in all planes. Images permanently stored in EMR. Interpretation: I personally interpreted the imaging intraoperatively. Adequate needle placement confirmed  in multiple planes. Appropriate spread of contrast into desired area was observed. No evidence of afferent or efferent intravascular uptake. No intrathecal or subarachnoid spread observed. Permanent images saved into the patient's record.  Antibiotic Prophylaxis:   Anti-infectives (From admission, onward)   None     Indication(s): None identified  Post-operative Assessment:  Post-procedure Vital Signs:  Pulse/HCG Rate: 8586 Temp: (!) 97.2 F (36.2 C) Resp: 13 BP: 122/74 SpO2: 100 %  EBL: None  Complications: No immediate post-treatment complications observed by team, or reported by patient.  Note: The patient tolerated the entire procedure well. A repeat set of vitals were taken after the procedure and the patient was kept under observation following institutional policy, for this type of procedure. Post-procedural neurological assessment was performed, showing return to baseline, prior to discharge. The patient was provided with post-procedure discharge instructions, including a section on how to identify potential problems. Should any problems arise concerning this procedure, the patient was given instructions to immediately contact , at any time, without hesitation. In any case, we plan to contact the patient by telephone for a follow-up status report regarding this interventional procedure.  Comments:  No additional relevant information.  Plan of Care  Orders:  Orders Placed This Encounter  Procedures  . Lumbar Epidural Injection    Scheduling Instructions:     Procedure: Interlaminar LESI L4-5     Laterality: Right-sided     Sedation: Patient's choice     Timeframe:  Today    Order Specific Question:   Where will this procedure be performed?    Answer:   [facility]  . DG [facility] C-ARM 1-60 MIN NO REPORT    Intraoperative interpretation by procedural physician at [facility].    Standing Status:   Standing    Number of Occurrences:   1     Order Specific Question:   Reason for exam:    Answer:   Assistance in needle guidance and placement for procedures requiring  needle placement in or near specific anatomical locations not easily accessible without such assistance.  . Informed Consent Details: Physician/Practitioner Attestation; Transcribe to consent form and obtain patient signature    Note: Always confirm laterality of pain with Ms. Bartolomei, before procedure. Transcribe to consent form and obtain patient signature.    Order Specific Question:   Physician/Practitioner attestation of informed consent for procedure/surgical case    Answer:   I, the physician/practitioner, attest that I have discussed with the patient the benefits, risks, side effects, alternatives, likelihood of achieving goals and potential problems during recovery for the procedure that I have provided informed consent.    Order Specific Question:   Procedure    Answer:   Lumbar epidural steroid injection under fluoroscopic guidance    Order Specific Question:   Physician/Practitioner performing the procedure    Answer:   Izamar Linden A. Laban Emperor, MD    Order Specific Question:   Indication/Reason    Answer:   Low back and/or lower extremity pain secondary to lumbar radiculitis  . Provide equipment / supplies at bedside    "Epidural Tray" (Disposable  single use) Catheter: NOT required    Standing Status:   Standing    Number of Occurrences:   1    Order Specific Question:   Specify    Answer:   Epidural Tray   Chronic Opioid Analgesic:  Tramadol 50 mg tablet, 1 tab p.o. 3 times daily (150 mg/day of tramadol) MME/day: 15 mg/day   Medications ordered for procedure: Meds ordered this encounter  Medications  . iohexol (OMNIPAQUE) 180 MG/ML injection 10 mL    Must be Myelogram-compatible. If not available, you may substitute with a water-soluble, non-ionic, hypoallergenic, myelogram-compatible radiological contrast medium.  Marland Kitchen lidocaine (XYLOCAINE) 2 % (with  pres) injection 400 mg  . sodium chloride flush (NS) 0.9 % injection 2 mL  . ropivacaine (PF) 2 mg/mL (0.2%) (NAROPIN) injection 2 mL  . triamcinolone acetonide (KENALOG-40) injection 40 mg  . traMADol (ULTRAM) 50 MG tablet    Sig: Take 1 tablet (50 mg total) by mouth every 8 (eight) hours as needed for severe pain. Must last 30 days    Dispense:  30 tablet    Refill:  0    Not a duplicate. Do NOT delete! Dispense 1 day early if closed on refill date. Avoid benzodiazepines within 8 hours of opioids. Do not send refill requests.  Marland Kitchen ePHEDrine injection 10 mg   Medications administered: We administered iohexol, lidocaine, sodium chloride flush, ropivacaine (PF) 2 mg/mL (0.2%), triamcinolone acetonide, and ePHEDrine.  See the medical record for exact dosing, route, and time of administration.  Follow-up plan:   Return in about 2 weeks (around 11/23/2020) for procedure day (afternoon F2F) (PPE).      Interventional Therapies  Risk  Complexity Considerations:   Estimated body mass index is 32.92 kg/m as calculated from the following:   Height as of this encounter:  (1.575 m).   Weight as of this encounter: 180 lb (81.6 kg). Prone to vasovagal reactions   Planned  Pending:   Pending further evaluation   Under consideration:   Diagnostic/therapeutic right L4-5 LESI #1  Diagnostic therapeutic bilateral L4 transforaminal ESI #1  Diagnostic bilateral lumbar facet block #1    Completed:   Diagnostic right L4-5 LESI x1 (vasovagal reaction) (11/09/2020)    Therapeutic  Palliative (PRN) options:   None established    Recent Visits Date Type Provider Dept  09/27/20 Office Visit Delano Metz, MD  [facility]  Showing recent visits within past 90 days and meeting all other requirements Today's Visits Date Type Provider Dept  11/09/20 Procedure visit Delano Metz, MD [facility]  Showing today's visits and meeting all other requirements Future  Appointments No visits were found meeting these conditions. Showing future appointments within next 90 days and meeting all other requirements  Disposition: Discharge home  Discharge (Date  Time): 11/09/2020; 0914 hrs.   Primary Care Physician: Patient, No Pcp Per (Inactive) Location: [facility] Note by: Oswaldo Done, MD Date: 11/09/2020; Time: 9:17 AM  Disclaimer:  Medicine is not an Visual merchandiser. The only guarantee in medicine is that nothing is guaranteed. It is important to note that the decision to proceed with this intervention was based on the information collected from the patient. The Data and conclusions were drawn from the patient's questionnaire, the interview, and the physical examination. Because the information was provided in large part by the patient, it cannot be guaranteed that it has not been purposely or unconsciously manipulated. Every effort has been made to obtain as much relevant data as possible for this evaluation. It is important to note that the conclusions that lead to this procedure are derived in large part from the available data. Always take into account that the treatment will also be dependent on availability of resources and existing treatment guidelines, considered by other Pain Management Practitioners as being common knowledge and practice, at the time of the intervention. For Medico-Legal purposes, it is also important to point out that variation in procedural techniques and pharmacological choices are the acceptable norm. The indications, contraindications, technique, and results of the above procedure should only be interpreted and judged by a Board-Certified Interventional Pain Specialist with extensive familiarity and expertise in the same exact procedure and technique.

## 2020-11-09 NOTE — Progress Notes (Signed)
PROVIDER NOTE: Information contained herein reflects review and annotations entered in association with encounter. Interpretation of such information and data should be left to medically-trained personnel. Information provided to patient can be located elsewhere in the medical record under "Patient Instructions". Document created using STT-dictation technology, any transcriptional errors that may result from process are unintentional.    Patient: Sandy Mitchell  Service Category: E/M  Provider: Gaspar Cola, MD  DOB: 10/15/1970  DOS: 11/09/2020  Specialty: Interventional Pain Management  MRN: [id number]  Setting: Ambulatory outpatient  PCP: Patient, No Pcp Per (Inactive)  Type: Established Patient    Referring Provider: No ref. provider found  Location: Office  Delivery: Face-to-face     Primary Reason(s) for Visit: Encounter for evaluation before starting new chronic pain management plan of care (Level of risk: moderate) CC: Back Pain  HPI  Sandy Mitchell is a 50 y.o. year old, female patient, who comes today for a follow-up evaluation to review the test results and decide on a treatment plan. She has Hypothyroidism; Headache disorder; Esophageal reflux; Spondylosis of lumbar region without myelopathy or radiculopathy; Major depressive disorder, single episode, unspecified; Generalized anxiety disorder; Abnormal Pap smear of cervix; Anxiety; Bilateral dry eyes; Obesity; Sleep disorder; Sleep disturbance; Contact dermatitis; Other recurrent depressive disorders ([place]); Dry mouth; Eczema; Edema; Family history of colonic polyps; Herniated disc, cervical; History of headache; Hyperlipidemia; Intractable chronic migraine without aura and without status migrainosus; Chronic low back pain (1ry area of Pain) (Bilateral) (R>L) w/ sciatica (Right); Menorrhagia with regular cycle; Migraine; Mild intermittent asthma without complication; Nausea; Non-cardiac chest pain; Chronic shoulder pain (Left); Chronic pain  syndrome; Fibromyalgia; Chronic lower extremity pain (2ry area of Pain) (Bilateral) (R>L); Poor posture; Restless leg; Counseling, unspecified; Sjogren's syndrome ([place]); Sjogren's syndrome with keratoconjunctivitis sicca ([place]); Sleeping difficulties; Strep throat; Stress; Vaginitis; Chronic cervical radiculopathy (C8) (Left); Lumbar radiculopathy; Spinal stenosis in cervical region; Spinal stenosis of lumbar region; Gastroesophageal reflux disease without esophagitis; Circadian rhythm sleep disorder, shift work type; Localized edema; Sprain of unspecified ligament of left ankle, sequela; Other physical and mental strain related to work; Lumbar central spinal stenosis (L4-5) w/o neurogenic claudication; Abnormal MRI, lumbar spinen (03/15/2020); Pharmacologic therapy; Disorder of skeletal system; Problems influencing health status; Cervicalgia; Radicular pain of shoulder (Left); History of cervical spinal surgery; Chronic radicular pain of lower extremity (S1 dermatome) (Bilateral) (R>L); Abnormal MRI, cervical spine (09/16/2019); Lumbar foraminal stenosis (L4-5) (Bilateral); Abnormal EMG (electromyogram) (upper extremity); Positive ANA (antinuclear antibody); Major depressive disorder, recurrent, moderate ([place]); Spinal stenosis, lumbar region without neurogenic claudication; Sprain of unspecified ligament of left ankle, initial encounter; DDD (degenerative disc disease), lumbosacral; Elevated C-reactive protein (CRP); Hypocalcemia; Chronic use of opiate for therapeutic purpose; Vasovagal episode; Transient hypotension; and Vagal bradycardia on their problem list. Her primarily concern today is the Back Pain  Pain Assessment: Location: Lower Back Radiating: ridown back of right leg to ankle usually Onset: More than a month ago Duration: Chronic pain Quality: Aching,Squeezing,Throbbing Severity: 3 /10 (subjective, self-reported pain score)  Effect on ADL: Prolonged walking, standing, stairs Timing:  Intermittent Modifying factors: rest, tens, patches BP: 122/74  HR: 85  Sandy Mitchell comes in today for a follow-up visit after her initial evaluation on 09/27/2020. Today we went over the results of her tests. These were explained in "Layman's terms". During today's appointment we went over my diagnostic impression, as well as the proposed treatment plan.  According to the patient, her problem started around September 2021 when she began to experience low back pain.  Currently she indicates that her low back pain seems to be worse than her lower extremity pain.  Her primary area of pain is that of the lower back (Bilateral) (R>L).  She denies any prior back surgeries or injection therapy.  She is currently undergoing physical therapy and she also indicates that this seems to be helping to a certain extent.  She has had acupuncture once a week, which apparently also seems to provide her with some temporary relief of the pain.  Around October 2021 she had a lumbar MRI.  She was initially seen by [facility], who recommended physical therapy regimen, but indicated that there was no surgical intervention that they could offer her.  She was apparently then sent to the [facility] hospital and from there she was referred to a neurosurgeon who also indicated that she did not have pathology that would warrant an extensive surgery.  She has not seen a decrease in her ability to ambulate or maneuver stairs.  She also indicates that this might have started around the time when she gained weight, several months ago.  The patient secondary area of pain inside of the lower extremities (Bilateral) (R>L).  She denies any prior surgeries in that area and she describes the pain as going down to the back of the leg to about the level of the ankle.  Occasionally she will experience pain and numbness into both of her feet and what seems to be an S1 dermatomal distribution.  She denies any lower extremity weakness, at this point.   She also denies any neurogenic claudication.  The patient's third area of pain is that of the neck (Bilateral) (L>R).  She has a history of a C6-7 ACDF.  The patient's fourth area pain is that of the left shoulder and left upper extremity.  She denies any pain towards the right shoulder or right upper extremity.  In the case of the left upper extremity she indicates that what predominates is numbness going down into her pinky and ring fingers (C8 dermatome).  She also indicates having had an EMG/PNCV that was "abnormal".  She refers that this was done at [facility], but she could not remember the name of the physician that did the study.  Pharmacologically, the patient indicates having taken NSAIDs and occasionally tramadol for the her pain.  In considering the treatment plan options, Ms. Tennison was reminded that I no longer take patients for medication management only. I asked her to let me know if she had no intention of taking advantage of the interventional therapies, so that we could make arrangements to provide this space to someone interested. I also made it clear that undergoing interventional therapies for the purpose of getting pain medications is very inappropriate on the part of a patient, and it will not be tolerated in this practice. This type of behavior would suggest true addiction and therefore it requires referral to an addiction specialist.   Further details on both, my assessment(s), as well as the proposed treatment plan, please see below.  Controlled Substance Pharmacotherapy Assessment REMS (Risk Evaluation and Mitigation Strategy)  Analgesic: Tramadol 50 mg tablet, 1 tab p.o. 3 times daily (150 mg/day of tramadol) MME/day: 15 mg/day  Pill Count: None expected due to no prior prescriptions written by our practice. Ignatius Specking, RN  11/09/2020  8:05 AM  Sign when Signing Visit Safety precautions to be maintained throughout the outpatient stay will include: orient to  surroundings, keep bed in low position, maintain call  bell within reach at all times, provide assistance with transfer out of bed and ambulation.    Pharmacokinetics: Liberation and absorption (onset of action): WNL Distribution (time to peak effect): WNL Metabolism and excretion (duration of action): WNL         Pharmacodynamics: Desired effects: Analgesia: Ms. Corne reports >50% benefit. Functional ability: Patient reports that medication allows her to accomplish basic ADLs Clinically meaningful improvement in function (CMIF): Sustained CMIF goals met Perceived effectiveness: Described as relatively effective, allowing for increase in activities of daily living (ADL) Undesirable effects: Side-effects or Adverse reactions: None reported Monitoring: [place] PMP: PDMP reviewed during this encounter. Online review of the past 56-month period previously conducted. Not applicable at this point since we have not taken over the patient's medication management yet. List of other Serum/Urine Drug Screening Test(s):  No results found for: AMPHSCRSER, BARBSCRSER, BENZOSCRSER, COCAINSCRSER, COCAINSCRNUR, PCPSCRSER, THCSCRSER, THCU, CANNABQUANT, [place], [place], [place], [place] List of all UDS test(s) done:  Lab Results  Component Value Date   SUMMARY Note 09/27/2020   Last UDS on record: Summary  Date Value Ref Range Status  09/27/2020 Note  Final    Comment:    ==================================================================== Compliance Drug Analysis, Ur ==================================================================== Test                             Result       Flag       Units  Drug Present and Declared for Prescription Verification   Tramadol                       >2358        EXPECTED   ng/mg creat   O-Desmethyltramadol            >2358        EXPECTED   ng/mg creat   N-Desmethyltramadol            732          EXPECTED   ng/mg creat    Source of tramadol is a  prescription medication. O-desmethyltramadol    and N-desmethyltramadol are expected metabolites of tramadol.    Gabapentin                     PRESENT      EXPECTED   Pregabalin                     PRESENT      EXPECTED   Sertraline                     PRESENT      EXPECTED   Desmethylsertraline            PRESENT      EXPECTED    Desmethylsertraline is an expected metabolite of sertraline.  Drug Present not Declared for Prescription Verification   Acetaminophen                  PRESENT      UNEXPECTED   Naproxen                       PRESENT      UNEXPECTED  Drug Absent but Declared for Prescription Verification   Topiramate                     Not Detected UNEXPECTED  Diclofenac                     Not Detected UNEXPECTED    Diclofenac, as indicated in the declared medication list, is not    always detected even when used as directed.  ==================================================================== Test                      Result    Flag   Units      Ref Range   Creatinine              212              mg/dL      >=20 ==================================================================== Declared Medications:  The flagging and interpretation on this report are based on the  following declared medications.  Unexpected results may arise from  inaccuracies in the declared medications.   **Note: The testing scope of this panel includes these medications:   Gabapentin  Pregabalin  Sertraline  Topiramate  Tramadol   **Note: The testing scope of this panel does not include small to  moderate amounts of these reported medications:   Diclofenac   **Note: The testing scope of this panel does not include the  following reported medications:   Eye Drop  Galcanezumab (Emgality)  Levothyroxine  Modafinil  Omeprazole  Ondansetron  Ranitidine ==================================================================== For clinical consultation, please call [phone]  [phone]. ====================================================================    UDS interpretation: No unexpected findings.          Medication Assessment Form: Patient introduced to form today Treatment compliance: Treatment may start today if patient agrees with proposed plan. Evaluation of compliance is not applicable at this point Risk Assessment Profile: Aberrant behavior: See initial evaluations. None observed or detected today Comorbid factors increasing risk of overdose: See initial evaluation. No additional risks detected today Opioid risk tool (ORT):  Opioid Risk  11/09/2020  Alcohol 1  Illegal Drugs 0  Rx Drugs 0  Alcohol 0  Illegal Drugs 0  Rx Drugs 0  Age between 16-45 years  -  History of Preadolescent Sexual Abuse 3  Psychological Disease 2  ADD Negative  OCD Negative  Bipolar Negative  Depression 1  Opioid Risk Tool Scoring 7  Opioid Risk Interpretation Moderate Risk    ORT Scoring interpretation table:  Score <3 = Low Risk for SUD  Score between 4-7 = Moderate Risk for SUD  Score >8 = High Risk for Opioid Abuse   Risk of substance use disorder (SUD): Low  Risk Mitigation Strategies:  Patient opioid safety counseling: Completed today. Counseling provided to patient as per "Patient Counseling Document". Document signed by patient, attesting to counseling and understanding Patient-Prescriber Agreement (PPA): Obtained today.  Controlled substance notification to other providers: Written and sent today.  Pharmacologic Plan: Today we may be taking over the patient's pharmacological regimen. See below.             Laboratory Chemistry Profile   Renal Lab Results  Component Value Date   BUN 14 09/27/2020   CREATININE 0.65 09/27/2020   BCR 22 09/27/2020   GFRAA >60 09/17/2015   GFRNONAA >60 09/17/2015     Electrolytes Lab Results  Component Value Date   NA 140 09/27/2020   K 4.3 09/27/2020   CL 106 09/27/2020   CALCIUM 8.5 (L) 09/27/2020   MG 2.2  09/27/2020   PHOS 4.8 (H) 09/16/2015     Hepatic Lab Results  Component Value Date   AST  24 09/27/2020   ALBUMIN 4.2 09/27/2020   ALKPHOS 85 09/27/2020     ID Lab Results  Component Value Date   MRSAPCR NEGATIVE 09/09/2015   PREGTESTUR NEGATIVE 09/10/2015     Bone Lab Results  Component Value Date   25OHVITD1 59 09/27/2020   25OHVITD2 27 09/27/2020   25OHVITD3 32 09/27/2020     Endocrine Lab Results  Component Value Date   GLUCOSE 93 09/27/2020   HGBA1C 5.6 09/12/2015     Neuropathy Lab Results  Component Value Date   VITAMINB12 521 09/27/2020   HGBA1C 5.6 09/12/2015     CNS No results found for: COLORCSF, APPEARCSF, RBCCOUNTCSF, WBCCSF, POLYSCSF, LYMPHSCSF, EOSCSF, PROTEINCSF, GLUCCSF, JCVIRUS, CSFOLI, IGGCSF, LABACHR, ACETBL, LABACHR, ACETBL   Inflammation (CRP: Acute  ESR: Chronic) Lab Results  Component Value Date   CRP 29 (H) 09/27/2020   ESRSEDRATE 25 09/27/2020   LATICACIDVEN 1.9 09/10/2015     Rheumatology No results found for: RF, ANA, LABURIC, URICUR, LYMEIGGIGMAB, LYMEABIGMQN, HLAB27   Coagulation Lab Results  Component Value Date   PLT 335 09/16/2015     Cardiovascular Lab Results  Component Value Date   HGB 10.6 (L) 09/16/2015   HCT 30.5 (L) 09/16/2015     Screening Lab Results  Component Value Date   MRSAPCR NEGATIVE 09/09/2015   PREGTESTUR NEGATIVE 09/10/2015     Cancer No results found for: CEA, CA125, LABCA2   Allergens No results found for: ALMOND, APPLE, ASPARAGUS, AVOCADO, BANANA, BARLEY, BASIL, BAYLEAF, GREENBEAN, LIMABEAN, WHITEBEAN, BEEFIGE, REDBEET, BLUEBERRY, BROCCOLI, CABBAGE, MELON, CARROT, CASEIN, CASHEWNUT, CAULIFLOWER, CELERY     Note: Lab results reviewed.  Recent Diagnostic Imaging Review  Cervical Imaging: Cervical DG Bending/F/E views: Results for orders placed during the hospital encounter of 09/30/20 DG Cervical Spine With Flex & Extend  Narrative CLINICAL DATA:  50 year old female with neck  pain.  EXAM: CERVICAL SPINE COMPLETE WITH FLEXION AND EXTENSION VIEWS  COMPARISON:  Cervical spine radiograph dated 09/04/2015.  FINDINGS: There is no acute fracture or subluxation of the cervical spine. No subluxation on the flexion or extension views. C6-C7 prostatic disc. The visualized posterior elements and odontoid appear intact. There is anatomic alignment of the lateral masses of C1 and C2. The neural foramina appear patent. The soft tissues are unremarkable.  IMPRESSION: 1. No acute/traumatic cervical spine pathology. 2. C6-C7 prostatic disc.   Electronically Signed By: Anner Crete M.D. On: 10/01/2020 18:21  Cervical DG complete: Results for orders placed during the hospital encounter of 09/04/15 DG Cervical Spine Complete  Narrative CLINICAL DATA:  Left neck and shoulder pain for 2 months. No known injury  EXAM: CERVICAL SPINE - COMPLETE 4+ VIEW  COMPARISON:  None.  FINDINGS: There is no evidence of cervical spine fracture or prevertebral soft tissue swelling. Alignment is normal. Mild anterior endplate osteophyte formation is seen at C5-6 without significant disc space narrowing. No other significant bone abnormality identified.  IMPRESSION: No acute findings. Mild vertebral endplate osteophyte formation at C5-6.   Electronically Signed By: Earle Gell M.D. On: 09/04/2015 09:07  Lumbosacral Imaging: Lumbar DG Bending views: Results for orders placed during the hospital encounter of 09/30/20 DG Lumbar Spine Complete W/Bend  Narrative CLINICAL DATA:  50 year old female with back pain.  EXAM: LUMBAR SPINE - COMPLETE WITH BENDING VIEWS  COMPARISON:  CT abdomen pelvis dated 09/10/2015.  FINDINGS: Five lumbar type vertebra. There is no acute fracture or subluxation of the lumbar spine. Mild disc space narrowing at L4-L5 and L5-S1. The visualized posterior  elements are intact. The soft tissues are unremarkable.  IMPRESSION: No  acute/traumatic lumbar spine pathology.   Electronically Signed By: Anner Crete M.D. On: 10/01/2020 18:22        Complexity Note: Imaging results reviewed. Results shared with Ms. Nudd, using [employer].              Meds   Current Outpatient Medications:  .  diclofenac (FLECTOR) 1.3 % PTCH, APPLY 1 PATCH TO SKIN TWO TIMES A DAY AS DIRECTED, Disp: 60 patch, Rfl: 3 .  fluorometholone (FML) 0.1 % ophthalmic suspension, PLACE ONE DROP INTO BOTH EYES EVERY OTHER DAY., Disp: 5 mL, Rfl: 0 .  levothyroxine (SYNTHROID) 75 MCG tablet, TAKE 1 TABLET BY MOUTH DAILY., Disp: 90 tablet, Rfl: 3 .  modafinil (PROVIGIL) 100 MG tablet, TAKE 1 TABLET BY MOUTH 2 TIMES A DAY., Disp: 60 tablet, Rfl: 1 .  omeprazole (PRILOSEC) 20 MG capsule, TAKE 1 CAPSULE BY MOUTH TWO TIMES A DAY, Disp: 60 capsule, Rfl: 3 .  ondansetron (ZOFRAN) 4 MG tablet, Take 4 mg by mouth every 8 (eight) hours as needed for nausea or vomiting., Disp: , Rfl:  .  topiramate (TOPAMAX) 200 MG tablet, Take 1 tablet (200 mg total) by mouth daily., Disp: 30 tablet, Rfl: 12 .  traMADol (ULTRAM) 50 MG tablet, Take 1 tablet (50 mg total) by mouth every 8 (eight) hours as needed for severe pain. Must last 30 days, Disp: 30 tablet, Rfl: 0  ROS  Constitutional: Denies any fever or chills Gastrointestinal: No reported hemesis, hematochezia, vomiting, or acute GI distress Musculoskeletal: Denies any acute onset joint swelling, redness, loss of ROM, or weakness Neurological: No reported episodes of acute onset apraxia, aphasia, dysarthria, agnosia, amnesia, paralysis, loss of coordination, or loss of consciousness  Allergies  Ms. Diesing is allergic to amlodipine, melatonin, phenergan [promethazine hcl], trazodone and nefazodone, atenolol, and nystatin.  [place]  Drug: Ms. Malmquist  reports no history of drug use. Alcohol:  reports no history of alcohol use. Tobacco:  reports that she has quit smoking. She has never used smokeless  tobacco. Medical:  has a past medical history of Abnormal Pap smear of cervix, Anxiety, Chronic neck pain, Depression, Fibromyalgia, Fibromyalgia, GERD (gastroesophageal reflux disease), Hypothyroid, Migraine, Migraine, Migraine, Sleep disorder, and Thyroid disease. Surgical: Ms. Santizo  has a past surgical history that includes Tubal ligation. Family: family history includes Colon cancer in her father; Diabetes in her father; Healthy in her mother; Hypertension in her father; Kidney failure in her father; Lung cancer in her maternal grandfather and paternal grandfather.  Constitutional Exam  General appearance: Well nourished, well developed, and well hydrated. In no apparent acute distress Vitals:   11/09/20 0900 11/09/20 0902 11/09/20 0908 11/09/20 0913  BP: (!) 101/50 120/81 121/69 122/74  Pulse:      Resp: 19 (!) _0 Temp:   (!) 97.2 F (36.2 C)   SpO2: 96% 96% 100% 100%  Weight:      Height:       BMI Assessment: Estimated body mass index is 32.92 kg/m as calculated from the following:   Height as of this encounter: _1  (1.575 m).   Weight as of this encounter: 180 lb (81.6 kg).  BMI interpretation table: BMI level Category Range association with higher incidence of chronic pain  <18 kg/m2 Underweight   18.5-24.9 kg/m2 Ideal body weight   25-29.9 kg/m2 Overweight Increased incidence by 20%  30-34.9 kg/m2 Obese (Class I) Increased incidence by  68%  35-39.9 kg/m2 Severe obesity (Class II) Increased incidence by 136%  >40 kg/m2 Extreme obesity (Class III) Increased incidence by 254%   Patient's current BMI Ideal Body weight  Body mass index is 32.92 kg/m. Ideal body weight: 50.1 kg (110 lb 7.2 oz) Adjusted ideal body weight: 62.7 kg (138 lb 4.3 oz)   BMI Readings from Last 4 Encounters:  11/09/20 32.92 kg/m  09/27/20 34.20 kg/m  07/01/18 30.90 kg/m  09/06/17 30.11 kg/m   Wt Readings from Last 4 Encounters:  11/09/20 180 lb (81.6 kg)  09/27/20 187 lb  (84.8 kg)  07/01/18 180 lb (81.6 kg)  09/06/17 170 lb (77.1 kg)    Psych/Mental status: Alert, oriented x 3 (person, place, & time)       Eyes: PERLA Respiratory: No evidence of acute respiratory distress  Assessment & Plan  Primary Diagnosis & Pertinent Problem List: The primary encounter diagnosis was Chronic radicular pain of lower extremity (S1 dermatome) (Bilateral) (R>L). Diagnoses of DDD (degenerative disc disease), lumbosacral, Lumbar central spinal stenosis (L4-5) w/o neurogenic claudication, Lumbar foraminal stenosis (L4-5) (Bilateral), Lumbar radiculopathy, Chronic low back pain (1ry area of Pain) (Bilateral) (R>L) w/ sciatica (Right), Chronic lower extremity pain (2ry area of Pain) (Bilateral) (R>L), Elevated C-reactive protein (CRP), Hypocalcemia, Abnormal MRI, lumbar spinen (03/15/2020), Chronic pain syndrome, Pharmacologic therapy, Chronic use of opiate for therapeutic purpose, Chronic radicular pain of lower extremity, Spinal stenosis of lumbar region without neurogenic claudication, Foraminal stenosis of lumbar region, Chronic bilateral low back pain with right-sided sciatica, Chronic pain of lower extremity, bilateral, Vasovagal episode, Transient hypotension, and Vagal bradycardia were also pertinent to this visit.  Visit Diagnosis: 1. Chronic radicular pain of lower extremity (S1 dermatome) (Bilateral) (R>L)   2. DDD (degenerative disc disease), lumbosacral   3. Lumbar central spinal stenosis (L4-5) w/o neurogenic claudication   4. Lumbar foraminal stenosis (L4-5) (Bilateral)   5. Lumbar radiculopathy   6. Chronic low back pain (1ry area of Pain) (Bilateral) (R>L) w/ sciatica (Right)   7. Chronic lower extremity pain (2ry area of Pain) (Bilateral) (R>L)   8. Elevated C-reactive protein (CRP)   9. Hypocalcemia   10. Abnormal MRI, lumbar spinen (03/15/2020)   11. Chronic pain syndrome   12. Pharmacologic therapy   13. Chronic use of opiate for therapeutic purpose   14.  Chronic radicular pain of lower extremity   15. Spinal stenosis of lumbar region without neurogenic claudication   16. Foraminal stenosis of lumbar region   17. Chronic bilateral low back pain with right-sided sciatica   18. Chronic pain of lower extremity, bilateral   19. Vasovagal episode   20. Transient hypotension   21. Vagal bradycardia    Problems updated and reviewed during this visit: Problem  Ddd (Degenerative Disc Disease), Lumbosacral  Abnormal EMG (electromyogram) (upper extremity)   History of C6-7 disc replacement and C7-T1 DDD. On exam she has some effort dependent weakness of wrist  extensors and elbow extensors that limit objective testing. She has guarding limiting reflex testing.    EMG Summary EMG was performed in the left upper limb. The pronator teres and deltoid are normal. The FDI, APB and triceps demonstrate normal insertional activity with evidence of motor unit reinnervation. Reduced recruitment is noted in the APB and triceps. The paraspinals were not tested due to history of cervical spine surgery.   Conclusions:  Abnormal study. There is electrophysiologic evidence of a left chronic cervical radiculopathy without ongoing denervation. The pattern of  abnormality most closely  matches the C8 root level, though cannot  rule out a polyradiculopathy.   Restless Leg   Last Assessment & Plan:  Formatting of this note might be different from the original. Occ restless leg likely related to iron .  We are checking iron studies today.   Positive Ana (Antinuclear Antibody)  Elevated C-Reactive Protein (Crp)  Hypocalcemia  Chronic Use of Opiate for Therapeutic Purpose  Vasovagal Episode  Transient Hypotension   (11/09/2020) interim procedure (lumbar epidural steroid injection) secondary to vasovagal reaction.  Resolved with 10 mg of IM ephedrine.   Vagal Bradycardia   (11/09/2020) interim procedure (lumbar epidural steroid injection) secondary to vasovagal reaction.   Resolved with 10 mg of IM ephedrine.   Major Depressive Disorder, Recurrent, Moderate (Hcc)  Spinal Stenosis, Lumbar Region Without Neurogenic Claudication  Sprain of Unspecified Ligament of Left Ankle, Initial Encounter  Smoker (Resolved)    Plan of Care  Pharmacotherapy (Medications Ordered): Meds ordered this encounter  Medications  . iohexol (OMNIPAQUE) 180 MG/ML injection 10 mL    Must be Myelogram-compatible. If not available, you may substitute with a water-soluble, non-ionic, hypoallergenic, myelogram-compatible radiological contrast medium.  Marland Kitchen lidocaine (XYLOCAINE) 2 % (with pres) injection 400 mg  . sodium chloride flush (NS) 0.9 % injection 2 mL  . ropivacaine (PF) 2 mg/mL (0.2%) (NAROPIN) injection 2 mL  . triamcinolone acetonide (KENALOG-40) injection 40 mg  . traMADol (ULTRAM) 50 MG tablet    Sig: Take 1 tablet (50 mg total) by mouth every 8 (eight) hours as needed for severe pain. Must last 30 days    Dispense:  30 tablet    Refill:  0    Not a duplicate. Do NOT delete! Dispense 1 day early if closed on refill date. Avoid benzodiazepines within 8 hours of opioids. Do not send refill requests.  Marland Kitchen ePHEDrine injection 10 mg    Procedure Orders     Lumbar Epidural Injection Lab Orders  No laboratory test(s) ordered today    Imaging Orders     DG PAIN CLINIC C-ARM 1-60 MIN NO REPORT Referral Orders  No referral(s) requested today    Pharmacological management options:  Opioid Analgesics: We'll take over management today. See above orders Membrane stabilizer: Options discussed, including a trial. Muscle relaxant: We have discussed the possibility of a trial NSAID: Trial discussed. Other analgesic(s): To be determined at a later time   Interventional Therapies  Risk  Complexity Considerations:   Estimated body mass index is 34.2 kg/m as calculated from the following:   Height as of 09/27/20: _0  (1.575 m).   Weight as of 09/27/20: 187 lb (84.8 kg). WNL    Planned  Pending:   Diagnostic/therapeutic right L4-5 LESI #1 (today)   Under consideration:   Diagnostic/therapeutic right L4-5 LESI #1  Diagnostic therapeutic bilateral L4 transforaminal ESI #1  Diagnostic bilateral lumbar facet block #1    Completed:   None at this time   Therapeutic  Palliative (PRN) options:   None established   Provider-requested follow-up: Return in about 2 weeks (around 11/23/2020) for procedure day (afternoon F2F) (PPE). Recent Visits Date Type Provider Dept  09/27/20 Office Visit Milinda Pointer, MD [facility]  Showing recent visits within past 90 days and meeting all other requirements Today's Visits Date Type Provider Dept  11/09/20 Procedure visit Milinda Pointer, MD [facility]  Showing today's visits and meeting all other requirements Future Appointments Date Type Provider Dept  12/13/20 Appointment Milinda Pointer, [facility]  Showing future appointments within next 90 days and meeting all other requirements  Primary Care Physician: Patient, No Pcp Per (Inactive) Note by: Gaspar Cola, MD Date: 11/09/2020; Time: 9:18 AM

## 2020-11-10 ENCOUNTER — Telehealth: Payer: Self-pay

## 2020-11-10 NOTE — Telephone Encounter (Signed)
Post procedure phone call.  LM 

## 2020-11-11 ENCOUNTER — Other Ambulatory Visit: Payer: Self-pay

## 2020-11-11 MED ORDER — EMGALITY 120 MG/ML ~~LOC~~ SOSY
PREFILLED_SYRINGE | SUBCUTANEOUS | 1 refills | Status: AC
  Filled 2020-11-11: qty 1, 30d supply, fill #0
  Filled 2020-11-16: qty 1, 1d supply, fill #0
  Filled 2020-11-16 – 2021-03-30 (×4): qty 1, 30d supply, fill #0

## 2020-11-11 MED ORDER — KETOROLAC TROMETHAMINE 10 MG PO TABS
ORAL_TABLET | ORAL | 0 refills | Status: DC
  Filled 2020-11-11: qty 20, 5d supply, fill #0

## 2020-11-11 MED ORDER — FLUOROMETHOLONE 0.1 % OP SUSP
OPHTHALMIC | 0 refills | Status: DC
  Filled 2020-11-11: qty 5, 30d supply, fill #0

## 2020-11-12 ENCOUNTER — Other Ambulatory Visit: Payer: Self-pay

## 2020-11-15 ENCOUNTER — Other Ambulatory Visit: Payer: Self-pay

## 2020-11-16 ENCOUNTER — Other Ambulatory Visit: Payer: Self-pay

## 2020-11-17 ENCOUNTER — Other Ambulatory Visit: Payer: Self-pay

## 2020-11-17 ENCOUNTER — Encounter: Payer: Self-pay | Admitting: Pain Medicine

## 2020-11-18 ENCOUNTER — Other Ambulatory Visit: Payer: Self-pay

## 2020-11-23 ENCOUNTER — Ambulatory Visit: Payer: No Typology Code available for payment source | Attending: Pain Medicine | Admitting: Pain Medicine

## 2020-11-23 ENCOUNTER — Other Ambulatory Visit: Payer: Self-pay

## 2020-11-23 ENCOUNTER — Encounter: Payer: Self-pay | Admitting: Pain Medicine

## 2020-11-23 VITALS — BP 121/93 | HR 104 | Temp 97.1°F | Resp 14 | Ht 62.0 in | Wt 180.0 lb

## 2020-11-23 DIAGNOSIS — G894 Chronic pain syndrome: Secondary | ICD-10-CM | POA: Insufficient documentation

## 2020-11-23 DIAGNOSIS — M5137 Other intervertebral disc degeneration, lumbosacral region: Secondary | ICD-10-CM | POA: Diagnosis present

## 2020-11-23 DIAGNOSIS — M79605 Pain in left leg: Secondary | ICD-10-CM | POA: Insufficient documentation

## 2020-11-23 DIAGNOSIS — Z79891 Long term (current) use of opiate analgesic: Secondary | ICD-10-CM

## 2020-11-23 DIAGNOSIS — Z79899 Other long term (current) drug therapy: Secondary | ICD-10-CM | POA: Insufficient documentation

## 2020-11-23 DIAGNOSIS — M48061 Spinal stenosis, lumbar region without neurogenic claudication: Secondary | ICD-10-CM | POA: Diagnosis not present

## 2020-11-23 DIAGNOSIS — G8929 Other chronic pain: Secondary | ICD-10-CM | POA: Insufficient documentation

## 2020-11-23 DIAGNOSIS — M5441 Lumbago with sciatica, right side: Secondary | ICD-10-CM | POA: Diagnosis present

## 2020-11-23 DIAGNOSIS — M5410 Radiculopathy, site unspecified: Secondary | ICD-10-CM | POA: Insufficient documentation

## 2020-11-23 DIAGNOSIS — M79604 Pain in right leg: Secondary | ICD-10-CM | POA: Insufficient documentation

## 2020-11-23 MED ORDER — TRAMADOL HCL 50 MG PO TABS
50.0000 mg | ORAL_TABLET | Freq: Three times a day (TID) | ORAL | 0 refills | Status: DC | PRN
  Filled 2020-11-23: qty 30, 10d supply, fill #0
  Filled 2020-12-29: qty 30, 30d supply, fill #0
  Filled 2020-12-30: qty 15, 5d supply, fill #0
  Filled 2021-01-03 – 2021-03-30 (×3): qty 15, 5d supply, fill #1

## 2020-11-23 NOTE — Progress Notes (Signed)
Safety precautions to be maintained throughout the outpatient stay will include: orient to surroundings, keep bed in low position, maintain call bell within reach at all times, provide assistance with transfer out of bed and ambulation.  

## 2020-11-23 NOTE — Progress Notes (Signed)
PROVIDER NOTE: Information contained herein reflects review and annotations entered in association with encounter. Interpretation of such information and data should be left to medically-trained personnel. Information provided to patient can be located elsewhere in the medical record under "Patient Instructions". Document created using STT-dictation technology, any transcriptional errors that may result from process are unintentional.    Patient: Sandy Mitchell  Service Category: E/M  Provider: Gaspar Cola, MD  DOB: 1971/05/23  DOS: 11/23/2020  Specialty: Interventional Pain Management  MRN: [id number]  Setting: Ambulatory outpatient  PCP: Patient, No Pcp Per (Inactive)  Type: Established Patient    Referring Provider: No ref. provider found  Location: Office  Delivery: Face-to-face     HPI  [name], a 50 y.o. year old female, is here today because of her Chronic bilateral low back pain with right-sided sciatica [M54.41, G89.29]. [name] primary complain today is Back Pain (lower) Last encounter: My last encounter with her was on 11/09/2020. Pertinent problems: [name] has Headache disorder; Spondylosis of lumbar region without myelopathy or radiculopathy; Herniated disc, cervical; History of headache; Intractable chronic migraine without aura and without status migrainosus; Chronic low back pain (1ry area of Pain) (Bilateral) (R>L) w/ sciatica (Right); Migraine; Non-cardiac chest pain; Chronic shoulder pain (Left); Chronic pain syndrome; Fibromyalgia; Chronic lower extremity pain (2ry area of Pain) (Bilateral) (R>L); Restless leg; Sjogren's syndrome ([place]); Chronic cervical radiculopathy (C8) (Left); Lumbar radiculopathy; Spinal stenosis in cervical region; Spinal stenosis of lumbar region; Sprain of unspecified ligament of left ankle, sequela; Lumbar central spinal stenosis (L4-5) w/o neurogenic claudication; Abnormal MRI, lumbar spinen (03/15/2020); Cervicalgia; Radicular  pain of shoulder (Left); History of cervical spinal surgery; Chronic radicular pain of lower extremity (S1 dermatome) (Bilateral) (R>L); Abnormal MRI, cervical spine (09/16/2019); Lumbar foraminal stenosis (L4-5) (Bilateral); Abnormal EMG (electromyogram) (upper extremity); Spinal stenosis, lumbar region without neurogenic claudication; Sprain of unspecified ligament of left ankle, initial encounter; and DDD (degenerative disc disease), lumbosacral on their pertinent problem list. Pain Assessment: Severity of Chronic pain is reported as a 0-No pain/10. Location: Back Lower/right leg to the foot, left outer leg to the thigh. Onset: More than a month ago. Quality: Aching, Throbbing, Squeezing. Timing: Intermittent. Modifying factor(s): changing position. Vitals:  height is '5\' 2"'  (1.575 m) and weight is 180 lb (81.6 kg). Her temporal temperature is 97.1 F (36.2 C) (abnormal). Her blood pressure is 121/93 (abnormal) and her pulse is 104 (abnormal). Her respiration is 14 and oxygen saturation is 99%.   Reason for encounter: both, medication management and post-procedure assessment.   The patient indicates doing well with the current medication regimen. No adverse reactions or side effects reported to the medications.   The patient did experience a vasovagal reflex during her first LESI.  Promptly recovered after treatment with ephedrine.  No sequela.  RTCB: 01/17/2021  Post-Procedure Evaluation  Procedure (11/09/2020): Diagnostic/therapeutic right L4-5 LESI #1 under fluoroscopic guidance, no sedation Pre-procedure pain level: 3/10 Post-procedure: 0/10 (100% relief)  Sedation: None.  Effectiveness during initial hour after procedure(Ultra-Short Term Relief): 70 %.  Local anesthetic used: Long-acting (4-6 hours) Effectiveness: Defined as any analgesic benefit obtained secondary to the administration of local anesthetics. This carries significant diagnostic value as to the etiological location, or anatomical  origin, of the pain. Duration of benefit is expected to coincide with the duration of the local anesthetic used.  Effectiveness during initial 4-6 hours after procedure(Short-Term Relief): 100 %.  Long-term benefit: Defined as any relief past the pharmacologic duration of the local anesthetics.  Effectiveness past the initial 6 hours after procedure(Long-Term Relief): 100 %.  Current benefits: Defined as benefit that persist at this time.   Analgesia:   The patient indicates currently enjoying an ongoing 100% relief of her pain. Function: [name] reports improvement in function ROM: [name] reports improvement in ROM  Pharmacotherapy Assessment  Analgesic: Tramadol 50 mg tablet, 1 tab p.o. 3 times daily (150 mg/day of tramadol) MME/day: 15 mg/day   Monitoring: Between PMP: PDMP reviewed during this encounter.       Pharmacotherapy: No side-effects or adverse reactions reported. Compliance: No problems identified. Effectiveness: Clinically acceptable.  Landis Martins, RN  11/23/2020  2:37 PM  Sign when Signing Visit Safety precautions to be maintained throughout the outpatient stay will include: orient to surroundings, keep bed in low position, maintain call bell within reach at all times, provide assistance with transfer out of bed and ambulation.     UDS:  Summary  Date Value Ref Range Status  09/27/2020 Note  Final    Comment:    ==================================================================== Compliance Drug Analysis, Ur ==================================================================== Test                             Result       Flag       Units  Drug Present and Declared for Prescription Verification   Tramadol                       >2358        EXPECTED   ng/mg creat   O-Desmethyltramadol            >2358        EXPECTED   ng/mg creat   N-Desmethyltramadol            732          EXPECTED   ng/mg creat    Source of tramadol is a prescription medication.  O-desmethyltramadol    and N-desmethyltramadol are expected metabolites of tramadol.    Gabapentin                     PRESENT      EXPECTED   Pregabalin                     PRESENT      EXPECTED   Sertraline                     PRESENT      EXPECTED   Desmethylsertraline            PRESENT      EXPECTED    Desmethylsertraline is an expected metabolite of sertraline.  Drug Present not Declared for Prescription Verification   Acetaminophen                  PRESENT      UNEXPECTED   Naproxen                       PRESENT      UNEXPECTED  Drug Absent but Declared for Prescription Verification   Topiramate                     Not Detected UNEXPECTED   Diclofenac  Not Detected UNEXPECTED    Diclofenac, as indicated in the declared medication list, is not    always detected even when used as directed.  ==================================================================== Test                      Result    Flag   Units      Ref Range   Creatinine              212              mg/dL      >=20 ==================================================================== Declared Medications:  The flagging and interpretation on this report are based on the  following declared medications.  Unexpected results may arise from  inaccuracies in the declared medications.   **Note: The testing scope of this panel includes these medications:   Gabapentin  Pregabalin  Sertraline  Topiramate  Tramadol   **Note: The testing scope of this panel does not include small to  moderate amounts of these reported medications:   Diclofenac   **Note: The testing scope of this panel does not include the  following reported medications:   Eye Drop  Galcanezumab (Emgality)  Levothyroxine  Modafinil  Omeprazole  Ondansetron  Ranitidine ==================================================================== For clinical consultation, please call [phone]. ====================================================================      ROS  Constitutional: Denies any fever or chills Gastrointestinal: No reported hemesis, hematochezia, vomiting, or acute GI distress Musculoskeletal: Denies any acute onset joint swelling, redness, loss of ROM, or weakness Neurological: No reported episodes of acute onset apraxia, aphasia, dysarthria, agnosia, amnesia, paralysis, loss of coordination, or loss of consciousness  Medication Review  Galcanezumab-gnlm, diclofenac, ergocalciferol, gabapentin, ketorolac, levothyroxine, modafinil, naproxen, omeprazole, ondansetron, sertraline, topiramate, and traMADol  History Review  Allergy: [name] is allergic to amlodipine, bupropion, melatonin, phenergan [promethazine hcl], trazodone and nefazodone, atenolol, and nystatin. Drug: [name]  reports no history of drug use. Alcohol:  reports no history of alcohol use. Tobacco:  reports that she has quit smoking. She has never used smokeless tobacco. Social: Ms. Ake  reports that she has quit smoking. She has never used smokeless tobacco. She reports that she does not drink alcohol and does not use drugs. Medical:  has a past medical history of Abnormal Pap smear of cervix, Anxiety, Chronic neck pain, Depression, Fibromyalgia, Fibromyalgia, GERD (gastroesophageal reflux disease), Hypothyroid, Migraine, Migraine, Migraine, Sleep disorder, and Thyroid disease. Surgical: Ms. Wussow  has a past surgical history that includes Tubal ligation. Family: family history includes Colon cancer in her father; Diabetes in her father; Healthy in her mother; Hypertension in her father; Kidney failure in her father; Lung cancer in her maternal grandfather and paternal grandfather.  Laboratory Chemistry Profile   Renal Lab Results  Component Value Date   BUN 14 09/27/2020   CREATININE 0.65 09/27/2020   BCR 22 09/27/2020   GFRAA >60 09/17/2015   GFRNONAA >60  09/17/2015     Hepatic Lab Results  Component Value Date   AST 24 09/27/2020   ALBUMIN 4.2 09/27/2020   ALKPHOS 85 09/27/2020     Electrolytes Lab Results  Component Value Date   NA 140 09/27/2020   K 4.3 09/27/2020   CL 106 09/27/2020   CALCIUM 8.5 (L) 09/27/2020   MG 2.2 09/27/2020   PHOS 4.8 (H) 09/16/2015     Bone Lab Results  Component Value Date   25OHVITD1 59 09/27/2020   25OHVITD2 27 09/27/2020   25OHVITD3 32 09/27/2020  Inflammation (CRP: Acute Phase) (ESR: Chronic Phase) Lab Results  Component Value Date   CRP 29 (H) 09/27/2020   ESRSEDRATE 25 09/27/2020   LATICACIDVEN 1.9 09/10/2015       Note: Above Lab results reviewed.  Recent Imaging Review  DG PAIN CLINIC C-ARM 1-60 MIN NO REPORT Fluoro was used, but no Radiologist interpretation will be provided.  Please refer to "NOTES" tab for provider progress note. Note: Reviewed        Physical Exam  General appearance: Well nourished, well developed, and well hydrated. In no apparent acute distress Mental status: Alert, oriented x 3 (person, place, & time)       Respiratory: No evidence of acute respiratory distress Eyes: PERLA Vitals: BP (!) 121/93   Pulse (!) 104   Temp (!) 97.1 F (36.2 C) (Temporal)   Resp 14   Ht '5\' 2"'  (1.575 m)   Wt 180 lb (81.6 kg)   SpO2 99%   BMI 32.92 kg/m  BMI: Estimated body mass index is 32.92 kg/m as calculated from the following:   Height as of this encounter: '5\' 2"'  (1.575 m).   Weight as of this encounter: 180 lb (81.6 kg). Ideal: Ideal body weight: 50.1 kg (110 lb 7.2 oz) Adjusted ideal body weight: 62.7 kg (138 lb 4.3 oz)  Assessment   Status Diagnosis  Controlled Controlled Controlled 1. Chronic low back pain (1ry area of Pain) (Bilateral) (R>L) w/ sciatica (Right)   2. Chronic lower extremity pain (2ry area of Pain) (Bilateral) (R>L)   3. Chronic radicular pain of lower extremity (S1 dermatome) (Bilateral) (R>L)   4. Lumbar central spinal stenosis  (L4-5) w/o neurogenic claudication   5. DDD (degenerative disc disease), lumbosacral   6. Chronic pain syndrome   7. Pharmacologic therapy   8. Chronic use of opiate for therapeutic purpose      Updated Problems: Problem  Spinal Stenosis, Lumbar Region Without Neurogenic Claudication  Sprain of Unspecified Ligament of Left Ankle, Initial Encounter  Major Depressive Disorder, Recurrent, Moderate (Hcc)    Plan of Care  Problem-specific:  No problem-specific Assessment & Plan notes found for this encounter.  [name] has a current medication list which includes the following long-term medication(s): levothyroxine, modafinil, omeprazole, topiramate, tramadol, and [START ON 12/18/2020] tramadol.  Pharmacotherapy (Medications Ordered): Meds ordered this encounter  Medications   traMADol (ULTRAM) 50 MG tablet    Sig: Take 1 tablet (50 mg total) by mouth every 8 (eight) hours as needed for severe pain. Must last 30 days    Dispense:  30 tablet    Refill:  0    Not a duplicate. Do NOT delete! Dispense 1 day early if closed on refill date. Avoid benzodiazepines within 8 hours of opioids. Do not send refill requests.    Orders:  Orders Placed This Encounter  Procedures   Lumbar Epidural Injection    Standing Status:   Standing    Number of Occurrences:   2    Standing Expiration Date:   05/25/2021    Scheduling Instructions:     Procedure: Interlaminar Lumbar Epidural Steroid injection (LESI)  L4-5     Laterality: Right-sided     Sedation: Patient's choice.     Timeframe: PRN    Order Specific Question:   Where will this procedure be performed?    Answer:   [facility]    Follow-up plan:   Return in about 8 weeks (around 01/17/2021) for evaluation day (F2F) (MM).  Interventional Therapies  Risk  Complexity Considerations:   Estimated body mass index is 32.92 kg/m as calculated from the following:   Height as of this encounter: '5\' 2"'  (1.575 m).   Weight  as of this encounter: 180 lb (81.6 kg). Prone to vasovagal reactions   Planned  Pending:   Pending further evaluation   Under consideration:   Diagnostic/therapeutic right L4-5 LESI #2  Diagnostic therapeutic bilateral L4 transforaminal ESI #1  Diagnostic bilateral lumbar facet block #1    Completed:   Diagnostic right L4-5 LESI x1 (vasovagal reaction) (11/09/2020)    Therapeutic  Palliative (PRN) options:   Therapeutic right L4-5 LESI #2     Recent Visits Date Type Provider Dept  11/09/20 Procedure visit Milinda Pointer, MD [facility]  09/27/20 Office Visit Milinda Pointer, MD [facility]  Showing recent visits within past 90 days and meeting all other requirements Today's Visits Date Type Provider Dept  11/23/20 Office Visit Milinda Pointer, MD [facility]  Showing today's visits and meeting all other requirements Future Appointments Date Type Provider Dept  01/19/21 Appointment Milinda Pointer, MD [facility]  Showing future appointments within next 90 days and meeting all other requirements I discussed the assessment and treatment plan with the patient. The patient was provided an opportunity to ask questions and all were answered. The patient agreed with the plan and demonstrated an understanding of the instructions.  Patient advised to call back or seek an in-person evaluation if the symptoms or condition worsens.  Duration of encounter: 30 minutes.  Note by: Gaspar Cola, MD Date: 11/23/2020; Time: 3:49 PM

## 2020-12-01 ENCOUNTER — Other Ambulatory Visit: Payer: Self-pay

## 2020-12-13 ENCOUNTER — Ambulatory Visit: Payer: No Typology Code available for payment source | Admitting: Pain Medicine

## 2020-12-29 ENCOUNTER — Encounter: Payer: Self-pay | Admitting: Pain Medicine

## 2020-12-29 ENCOUNTER — Other Ambulatory Visit: Payer: Self-pay

## 2020-12-29 MED FILL — Levothyroxine Sodium Tab 75 MCG: ORAL | 90 days supply | Qty: 90 | Fill #0 | Status: AC

## 2020-12-29 MED FILL — Modafinil Tab 100 MG: ORAL | 30 days supply | Qty: 60 | Fill #0 | Status: CN

## 2020-12-30 ENCOUNTER — Encounter: Payer: Self-pay | Admitting: *Deleted

## 2020-12-30 ENCOUNTER — Other Ambulatory Visit: Payer: Self-pay

## 2020-12-30 MED ORDER — KETOROLAC TROMETHAMINE 10 MG PO TABS
ORAL_TABLET | ORAL | 0 refills | Status: DC
  Filled 2020-12-30: qty 20, 5d supply, fill #0

## 2020-12-31 ENCOUNTER — Other Ambulatory Visit: Payer: Self-pay

## 2021-01-03 ENCOUNTER — Other Ambulatory Visit: Payer: Self-pay

## 2021-01-03 MED FILL — Modafinil Tab 100 MG: ORAL | 30 days supply | Qty: 60 | Fill #0 | Status: CN

## 2021-01-07 ENCOUNTER — Other Ambulatory Visit: Payer: Self-pay

## 2021-01-10 ENCOUNTER — Other Ambulatory Visit: Payer: Self-pay

## 2021-01-16 NOTE — Progress Notes (Deleted)
PROVIDER NOTE: Information contained herein reflects review and annotations entered in association with encounter. Interpretation of such information and data should be left to medically-trained personnel. Information provided to patient can be located elsewhere in the medical record under "Patient Instructions". Document created using STT-dictation technology, any transcriptional errors that may result from process are unintentional.    Patient: Sandy Mitchell  Service Category: E/M  Provider: Gaspar Cola, MD  DOB: 1971/04/06  DOS: 01/19/2021  Specialty: Interventional Pain Management  MRN: [id number]  Setting: Ambulatory outpatient  PCP: Patient, No Pcp Per (Inactive)  Type: Established Patient    Referring Provider: No ref. provider found  Location: Office  Delivery: Face-to-face     HPI  [name], a 50 y.o. year old female, is here today because of her No primary diagnosis found.. [name]'s primary complain today is No chief complaint on file. Last encounter: My last encounter with her was on 11/23/2020. Pertinent problems: [name] has Headache disorder; Spondylosis of lumbar region without myelopathy or radiculopathy; Herniated disc, cervical; History of headache; Intractable chronic migraine without aura and without status migrainosus; Chronic low back pain (1ry area of Pain) (Bilateral) (R>L) w/ sciatica (Right); Migraine; Non-cardiac chest pain; Chronic shoulder pain (Left); Chronic pain syndrome; Fibromyalgia; Chronic lower extremity pain (2ry area of Pain) (Bilateral) (R>L); Restless leg; Sjogren's syndrome ([place]); Chronic cervical radiculopathy (C8) (Left); Lumbar radiculopathy; Spinal stenosis in cervical region; Spinal stenosis of lumbar region; Sprain of unspecified ligament of left ankle, sequela; Lumbar central spinal stenosis (L4-5) w/o neurogenic claudication; Abnormal MRI, lumbar spinen (03/15/2020); Cervicalgia; Radicular pain of shoulder (Left); History of  cervical spinal surgery; Chronic radicular pain of lower extremity (S1 dermatome) (Bilateral) (R>L); Abnormal MRI, cervical spine (09/16/2019); Lumbar foraminal stenosis (L4-5) (Bilateral); Abnormal EMG (electromyogram) (upper extremity); Spinal stenosis, lumbar region without neurogenic claudication; Sprain of unspecified ligament of left ankle, initial encounter; and DDD (degenerative disc disease), lumbosacral on their pertinent problem list. Pain Assessment: Severity of   is reported as a  /10. Location:    / . Onset:  . Quality:  . Timing:  . Modifying factor(s):  Marland Kitchen Vitals:  vitals were not taken for this visit.   Reason for encounter:  ***   ***  Pharmacotherapy Assessment  Analgesic: Tramadol 50 mg tablet, 1 tab p.o. 3 times daily (150 mg/day of tramadol) MME/day: 15 mg/day   Monitoring: [place] PMP: PDMP reviewed during this encounter.       Pharmacotherapy: No side-effects or adverse reactions reported. Compliance: No problems identified. Effectiveness: Clinically acceptable.  No notes on file  UDS:  Summary  Date Value Ref Range Status  09/27/2020 Note  Final    Comment:    ==================================================================== Compliance Drug Analysis, Ur ==================================================================== Test                             Result       Flag       Units  Drug Present and Declared for Prescription Verification   Tramadol                       >2358        EXPECTED   ng/mg creat   O-Desmethyltramadol            >2358        EXPECTED   ng/mg creat   N-Desmethyltramadol  732          EXPECTED   ng/mg creat    Source of tramadol is a prescription medication. O-desmethyltramadol    and N-desmethyltramadol are expected metabolites of tramadol.    Gabapentin                     PRESENT      EXPECTED   Pregabalin                     PRESENT      EXPECTED   Sertraline                     PRESENT      EXPECTED   Desmethylsertraline             PRESENT      EXPECTED    Desmethylsertraline is an expected metabolite of sertraline.  Drug Present not Declared for Prescription Verification   Acetaminophen                  PRESENT      UNEXPECTED   Naproxen                       PRESENT      UNEXPECTED  Drug Absent but Declared for Prescription Verification   Topiramate                     Not Detected UNEXPECTED   Diclofenac                     Not Detected UNEXPECTED    Diclofenac, as indicated in the declared medication list, is not    always detected even when used as directed.  ==================================================================== Test                      Result    Flag   Units      Ref Range   Creatinine              212              mg/dL      >=20 ==================================================================== Declared Medications:  The flagging and interpretation on this report are based on the  following declared medications.  Unexpected results may arise from  inaccuracies in the declared medications.   **Note: The testing scope of this panel includes these medications:   Gabapentin  Pregabalin  Sertraline  Topiramate  Tramadol   **Note: The testing scope of this panel does not include small to  moderate amounts of these reported medications:   Diclofenac   **Note: The testing scope of this panel does not include the  following reported medications:   Eye Drop  Galcanezumab (Emgality)  Levothyroxine  Modafinil  Omeprazole  Ondansetron  Ranitidine ==================================================================== For clinical consultation, please call [phone]. ====================================================================      ROS  Constitutional: Denies any fever or chills Gastrointestinal: No reported hemesis, hematochezia, vomiting, or acute GI distress Musculoskeletal: Denies any acute onset joint swelling, redness, loss of ROM, or  weakness Neurological: No reported episodes of acute onset apraxia, aphasia, dysarthria, agnosia, amnesia, paralysis, loss of coordination, or loss of consciousness  Medication Review  Galcanezumab-gnlm, diclofenac, ergocalciferol, gabapentin, ketorolac, levothyroxine, modafinil, naproxen, omeprazole, ondansetron, sertraline, topiramate, and traMADol  History Review  Allergy: [name] is allergic to amlodipine, bupropion, melatonin, phenergan [promethazine hcl], trazodone and nefazodone,  atenolol, and nystatin. Drug: [name]  reports no history of drug use. Alcohol:  reports no history of alcohol use. Tobacco:  reports that she has quit smoking. She has never used smokeless tobacco. Social: [name]  reports that she has quit smoking. She has never used smokeless tobacco. She reports that she does not drink alcohol and does not use drugs. Medical:  has a past medical history of Abnormal Pap smear of cervix, Anxiety, Chronic neck pain, Depression, Fibromyalgia, Fibromyalgia, GERD (gastroesophageal reflux disease), Hypothyroid, Migraine, Migraine, Migraine, Sleep disorder, and Thyroid disease. Surgical: [name]  has a past surgical history that includes Tubal ligation. Family: family history includes Colon cancer in her father; Diabetes in her father; Healthy in her mother; Hypertension in her father; Kidney failure in her father; Lung cancer in her maternal grandfather and paternal grandfather.  Laboratory Chemistry Profile   Renal Lab Results  Component Value Date   BUN 14 09/27/2020   CREATININE 0.65 09/27/2020   BCR 22 09/27/2020   GFRAA >60 09/17/2015   GFRNONAA >60 09/17/2015    Hepatic Lab Results  Component Value Date   AST 24 09/27/2020   ALBUMIN 4.2 09/27/2020   ALKPHOS 85 09/27/2020    Electrolytes Lab Results  Component Value Date   NA 140 09/27/2020   K 4.3 09/27/2020   CL 106 09/27/2020   CALCIUM 8.5 (L) 09/27/2020   MG 2.2 09/27/2020   PHOS  4.8 (H) 09/16/2015    Bone Lab Results  Component Value Date   25OHVITD1 59 09/27/2020   25OHVITD2 27 09/27/2020   25OHVITD3 32 09/27/2020    Inflammation (CRP: Acute Phase) (ESR: Chronic Phase) Lab Results  Component Value Date   CRP 29 (H) 09/27/2020   ESRSEDRATE 25 09/27/2020   LATICACIDVEN 1.9 09/10/2015         Note: Above Lab results reviewed.  Recent Imaging Review  DG PAIN CLINIC C-ARM 1-60 MIN NO REPORT Fluoro was used, but no Radiologist interpretation will be provided.  Please refer to "NOTES" tab for provider progress note. Note: Reviewed        Physical Exam  General appearance: Well nourished, well developed, and well hydrated. In no apparent acute distress Mental status: Alert, oriented x 3 (person, place, & time)       Respiratory: No evidence of acute respiratory distress Eyes: PERLA Vitals: There were no vitals taken for this visit. BMI: Estimated body mass index is 32.92 kg/m as calculated from the following:   Height as of 11/23/20: '5\' 2"'  (1.575 m).   Weight as of 11/23/20: 180 lb (81.6 kg). Ideal: Patient weight not recorded  Assessment   Status Diagnosis  Controlled Controlled Controlled No diagnosis found.   Updated Problems: No problems updated.  Plan of Care  Problem-specific:  No problem-specific Assessment & Plan notes found for this encounter.  [name] has a current medication list which includes the following long-term medication(s): levothyroxine, modafinil, omeprazole, topiramate, tramadol, and tramadol.  Pharmacotherapy (Medications Ordered): No orders of the defined types were placed in this encounter.  Orders:  No orders of the defined types were placed in this encounter.  Follow-up plan:   No follow-ups on file.     Interventional Therapies  Risk  Complexity Considerations:   Estimated body mass index is 32.92 kg/m as calculated from the following:   Height as of this encounter: '5\' 2"'  (1.575 m).   Weight  as of this encounter: 180 lb (81.6 kg). Prone to vasovagal  reactions   Planned  Pending:   Pending further evaluation   Under consideration:   Diagnostic/therapeutic right L4-5 LESI #2  Diagnostic therapeutic bilateral L4 transforaminal ESI #1  Diagnostic bilateral lumbar facet block #1    Completed:   Diagnostic right L4-5 LESI x1 (vasovagal reaction) (11/09/2020)    Therapeutic  Palliative (PRN) options:   Therapeutic right L4-5 LESI #2      Recent Visits Date Type Provider Dept  11/23/20 Office Visit Milinda Pointer, MD [facility]  11/09/20 Procedure visit Milinda Pointer, MD [facility]  Showing recent visits within past 90 days and meeting all other requirements Future Appointments Date Type Provider Dept  01/19/21 Appointment Milinda Pointer, MD [facility]  Showing future appointments within next 90 days and meeting all other requirements I discussed the assessment and treatment plan with the patient. The patient was provided an opportunity to ask questions and all were answered. The patient agreed with the plan and demonstrated an understanding of the instructions.  Patient advised to call back or seek an in-person evaluation if the symptoms or condition worsens.  Duration of encounter: *** minutes.  Note by: Gaspar Cola, MD Date: 01/19/2021; Time: 5:48 PM

## 2021-01-17 ENCOUNTER — Other Ambulatory Visit: Payer: Self-pay

## 2021-01-19 ENCOUNTER — Encounter: Payer: No Typology Code available for payment source | Admitting: Pain Medicine

## 2021-01-20 ENCOUNTER — Encounter: Payer: Self-pay | Admitting: Pain Medicine

## 2021-01-24 ENCOUNTER — Other Ambulatory Visit: Payer: Self-pay

## 2021-03-30 ENCOUNTER — Other Ambulatory Visit: Payer: Self-pay

## 2021-03-30 MED ORDER — KETOROLAC TROMETHAMINE 10 MG PO TABS
ORAL_TABLET | ORAL | 0 refills | Status: AC
  Filled 2021-03-30: qty 20, 5d supply, fill #0

## 2021-03-30 MED FILL — Omeprazole Cap Delayed Release 20 MG: ORAL | 30 days supply | Qty: 60 | Fill #0 | Status: AC

## 2021-03-30 MED FILL — Omeprazole Cap Delayed Release 20 MG: ORAL | Qty: 60 | Fill #0 | Status: CN

## 2021-03-30 MED FILL — Levothyroxine Sodium Tab 75 MCG: ORAL | 90 days supply | Qty: 90 | Fill #1 | Status: AC

## 2021-03-31 ENCOUNTER — Other Ambulatory Visit: Payer: Self-pay

## 2021-06-09 NOTE — Progress Notes (Deleted)
PROVIDER NOTE: Information contained herein reflects review and annotations entered in association with encounter. Interpretation of such information and data should be left to medically-trained personnel. Information provided to patient can be located elsewhere in the medical record under "Patient Instructions". Document created using STT-dictation technology, any transcriptional errors that may result from process are unintentional.    Patient: Sandy Mitchell  Service Category: E/M  Provider: Gaspar Cola, MD  DOB: 25-Jan-1971  DOS: 06/13/2021  Specialty: Interventional Pain Management  MRN: [id number]  Setting: Ambulatory outpatient  PCP: Patient, No Pcp Per (Inactive)  Type: Established Patient    Referring Provider: No ref. provider found  Location: Office  Delivery: Face-to-face     HPI  Sandy Mitchell, a 51 y.o. year old female, is here today because of her No primary diagnosis found.. Sandy Mitchell's primary complain today is No chief complaint on file. Last encounter: My last encounter with her was on Visit date not found. Pertinent problems: [name] has Headache disorder; Spondylosis of lumbar region without myelopathy or radiculopathy; Herniated disc, cervical; History of headache; Intractable chronic migraine without aura and without status migrainosus; Chronic low back pain (1ry area of Pain) (Bilateral) (R>L) w/ sciatica (Right); Migraine; Non-cardiac chest pain; Chronic shoulder pain (Left); Chronic pain syndrome; Fibromyalgia; Chronic lower extremity pain (2ry area of Pain) (Bilateral) (R>L); Restless leg; Sjogren's syndrome ([place]); Chronic cervical radiculopathy (C8) (Left); Lumbar radiculopathy; Spinal stenosis in cervical region; Spinal stenosis of lumbar region; Sprain of unspecified ligament of left ankle, sequela; Lumbar central spinal stenosis (L4-5) w/o neurogenic claudication; Abnormal MRI, lumbar spinen (03/15/2020); Cervicalgia; Radicular pain of shoulder (Left);  History of cervical spinal surgery; Chronic radicular pain of lower extremity (S1 dermatome) (Bilateral) (R>L); Abnormal MRI, cervical spine (09/16/2019); Lumbar foraminal stenosis (L4-5) (Bilateral); Abnormal EMG (electromyogram) (upper extremity); Spinal stenosis, lumbar region without neurogenic claudication; Sprain of unspecified ligament of left ankle, initial encounter; and DDD (degenerative disc disease), lumbosacral on their pertinent problem list. Pain Assessment: Severity of   is reported as a  /10. Location:    / . Onset:  . Quality:  . Timing:  . Modifying factor(s):  Marland Kitchen Vitals:  vitals were not taken for this visit.   Reason for encounter: medication management.  ***  Pharmacotherapy Assessment  Analgesic: Tramadol 50 mg tablet, 1 tab p.o. 3 times daily (150 mg/day of tramadol) MME/day: 15 mg/day   Monitoring: [place] PMP: PDMP reviewed during this encounter.       Pharmacotherapy: No side-effects or adverse reactions reported. Compliance: No problems identified. Effectiveness: Clinically acceptable.  No notes on file  UDS:  Summary  Date Value Ref Range Status  09/27/2020 Note  Final    Comment:    ==================================================================== Compliance Drug Analysis, Ur ==================================================================== Test                             Result       Flag       Units  Drug Present and Declared for Prescription Verification   Tramadol                       >2358        EXPECTED   ng/mg creat   O-Desmethyltramadol            >2358        EXPECTED   ng/mg creat   N-Desmethyltramadol  732          EXPECTED   ng/mg creat    Source of tramadol is a prescription medication. O-desmethyltramadol    and N-desmethyltramadol are expected metabolites of tramadol.    Gabapentin                     PRESENT      EXPECTED   Pregabalin                     PRESENT      EXPECTED   Sertraline                     PRESENT       EXPECTED   Desmethylsertraline            PRESENT      EXPECTED    Desmethylsertraline is an expected metabolite of sertraline.  Drug Present not Declared for Prescription Verification   Acetaminophen                  PRESENT      UNEXPECTED   Naproxen                       PRESENT      UNEXPECTED  Drug Absent but Declared for Prescription Verification   Topiramate                     Not Detected UNEXPECTED   Diclofenac                     Not Detected UNEXPECTED    Diclofenac, as indicated in the declared medication list, is not    always detected even when used as directed.  ==================================================================== Test                      Result    Flag   Units      Ref Range   Creatinine              212              mg/dL      >=20 ==================================================================== Declared Medications:  The flagging and interpretation on this report are based on the  following declared medications.  Unexpected results may arise from  inaccuracies in the declared medications.   **Note: The testing scope of this panel includes these medications:   Gabapentin  Pregabalin  Sertraline  Topiramate  Tramadol   **Note: The testing scope of this panel does not include small to  moderate amounts of these reported medications:   Diclofenac   **Note: The testing scope of this panel does not include the  following reported medications:   Eye Drop  Galcanezumab (Emgality)  Levothyroxine  Modafinil  Omeprazole  Ondansetron  Ranitidine ==================================================================== For clinical consultation, please call [phone]. ====================================================================      ROS  Constitutional: Denies any fever or chills Gastrointestinal: No reported hemesis, hematochezia, vomiting, or acute GI distress Musculoskeletal: Denies any acute onset joint swelling, redness,  loss of ROM, or weakness Neurological: No reported episodes of acute onset apraxia, aphasia, dysarthria, agnosia, amnesia, paralysis, loss of coordination, or loss of consciousness  Medication Review  Galcanezumab-gnlm, ergocalciferol, gabapentin, ketorolac, levothyroxine, modafinil, naproxen, omeprazole, ondansetron, sertraline, and topiramate  History Review  Allergy: Ms. Batley is allergic to amlodipine, bupropion, melatonin, phenergan [promethazine hcl], trazodone and nefazodone, atenolol, and  nystatin. Drug: Ms. Edwin  reports no history of drug use. Alcohol:  reports no history of alcohol use. Tobacco:  reports that she has quit smoking. She has never used smokeless tobacco. Social: Ms. Tuff  reports that she has quit smoking. She has never used smokeless tobacco. She reports that she does not drink alcohol and does not use drugs. Medical:  has a past medical history of Abnormal Pap smear of cervix, Anxiety, Chronic neck pain, Depression, Fibromyalgia, Fibromyalgia, GERD (gastroesophageal reflux disease), Hypothyroid, Migraine, Migraine, Migraine, Sleep disorder, and Thyroid disease. Surgical: Ms. Tenaglia  has a past surgical history that includes Tubal ligation. Family: family history includes Colon cancer in her father; Diabetes in her father; Healthy in her mother; Hypertension in her father; Kidney failure in her father; Lung cancer in her maternal grandfather and paternal grandfather.  Laboratory Chemistry Profile   Renal Lab Results  Component Value Date   BUN 14 09/27/2020   CREATININE 0.65 09/27/2020   BCR 22 09/27/2020   GFRAA >60 09/17/2015   GFRNONAA >60 09/17/2015    Hepatic Lab Results  Component Value Date   AST 24 09/27/2020   ALBUMIN 4.2 09/27/2020   ALKPHOS 85 09/27/2020    Electrolytes Lab Results  Component Value Date   NA 140 09/27/2020   K 4.3 09/27/2020   CL 106 09/27/2020   CALCIUM 8.5 (L) 09/27/2020   MG 2.2 09/27/2020   PHOS 4.8  (H) 09/16/2015    Bone Lab Results  Component Value Date   25OHVITD1 59 09/27/2020   25OHVITD2 27 09/27/2020   25OHVITD3 32 09/27/2020    Inflammation (CRP: Acute Phase) (ESR: Chronic Phase) Lab Results  Component Value Date   CRP 29 (H) 09/27/2020   ESRSEDRATE 25 09/27/2020   LATICACIDVEN 1.9 09/10/2015         Note: Above Lab results reviewed.  Recent Imaging Review  DG PAIN CLINIC C-ARM 1-60 MIN NO REPORT Fluoro was used, but no Radiologist interpretation will be provided.  Please refer to "NOTES" tab for provider progress note. Note: Reviewed        Physical Exam  General appearance: Well nourished, well developed, and well hydrated. In no apparent acute distress Mental status: Alert, oriented x 3 (person, place, & time)       Respiratory: No evidence of acute respiratory distress Eyes: PERLA Vitals: There were no vitals taken for this visit. BMI: Estimated body mass index is 32.92 kg/m as calculated from the following:   Height as of 11/23/20: _0  (1.575 m).   Weight as of 11/23/20: 180 lb (81.6 kg). Ideal: Patient weight not recorded  Assessment   Status Diagnosis  Controlled Controlled Controlled 1. Chronic pain syndrome   2. Pharmacologic therapy   3. Chronic use of opiate for therapeutic purpose      Updated Problems: No problems updated.  Plan of Care  Problem-specific:  No problem-specific Assessment & Plan notes found for this encounter.  Ms. Teliah Buffalo has a current medication list which includes the following long-term medication(s): levothyroxine, modafinil, omeprazole, and topiramate.  Pharmacotherapy (Medications Ordered): No orders of the defined types were placed in this encounter.  Orders:  No orders of the defined types were placed in this encounter.  Follow-up plan:   No follow-ups on file.     Interventional Therapies  Risk   Complexity Considerations:   Estimated body mass index is 32.92 kg/m as calculated from the  following:   Height as of this encounter: _1  (  1.575 m).   Weight as of this encounter: 180 lb (81.6 kg). Prone to vasovagal reactions   Planned   Pending:   Pending further evaluation   Under consideration:   Diagnostic/therapeutic right L4-5 LESI #2  Diagnostic therapeutic bilateral L4 transforaminal ESI #1  Diagnostic bilateral lumbar facet block #1    Completed:   Diagnostic right L4-5 LESI x1 (vasovagal reaction) (11/09/2020)    Therapeutic   Palliative (PRN) options:   Therapeutic right L4-5 LESI #2      Recent Visits No visits were found meeting these conditions. Showing recent visits within past 90 days and meeting all other requirements Future Appointments Date Type Provider Dept  06/13/21 Appointment Milinda Pointer, MD [facility]  Showing future appointments within next 90 days and meeting all other requirements  I discussed the assessment and treatment plan with the patient. The patient was provided an opportunity to ask questions and all were answered. The patient agreed with the plan and demonstrated an understanding of the instructions.  Patient advised to call back or seek an in-person evaluation if the symptoms or condition worsens.  Duration of encounter: *** minutes.  Note by: Gaspar Cola, MD Date: 06/13/2021; Time: 3:24 PM

## 2021-06-13 ENCOUNTER — Encounter: Payer: No Typology Code available for payment source | Admitting: Pain Medicine

## 2021-09-10 IMAGING — CR DG CERVICAL SPINE WITH FLEX & EXTEND
1 series · 9 of 9 positions shown · non-contrast
Comparison: Cervical spine radiograph dated 09/04/2015.

CLINICAL DATA: 49-year-old female with neck pain.

EXAM:
CERVICAL SPINE COMPLETE WITH FLEXION AND EXTENSION VIEWS

[Series 1: dg cervical spine with flex & extend · 0.14mm/px · 9 of 9 slices shown]
[im 1/9]
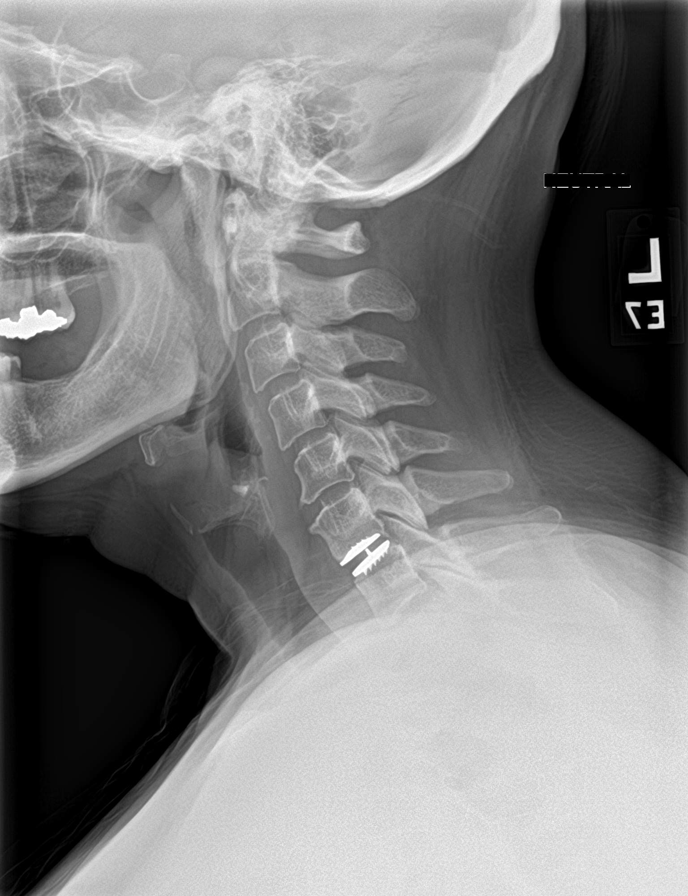
[im 2/9]
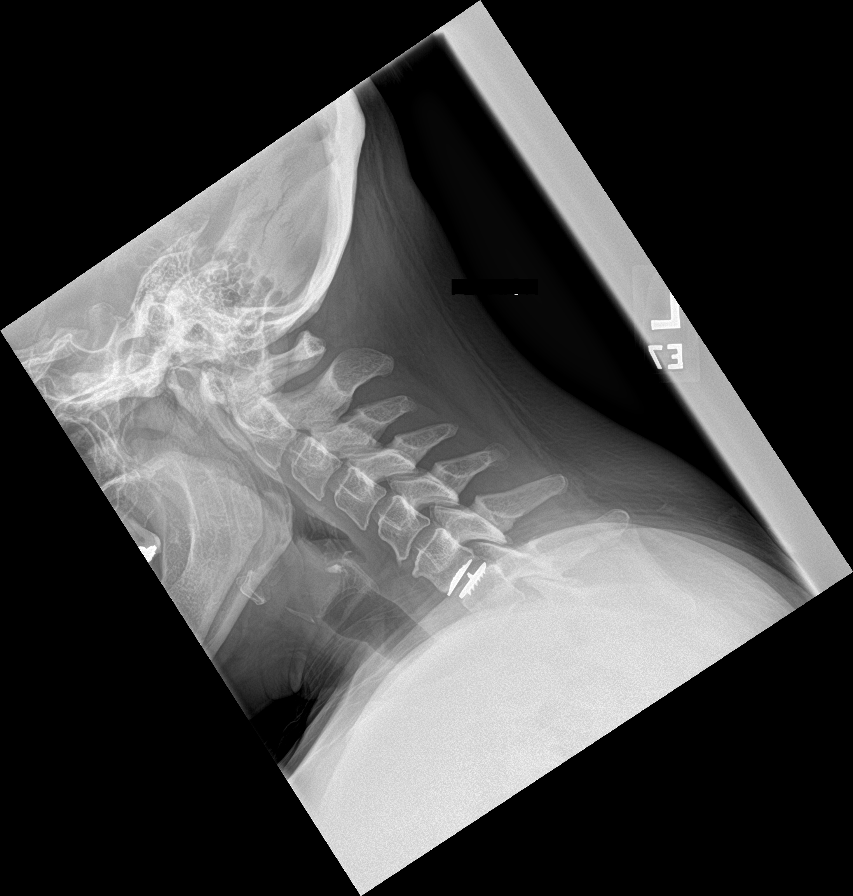
[im 3/9]
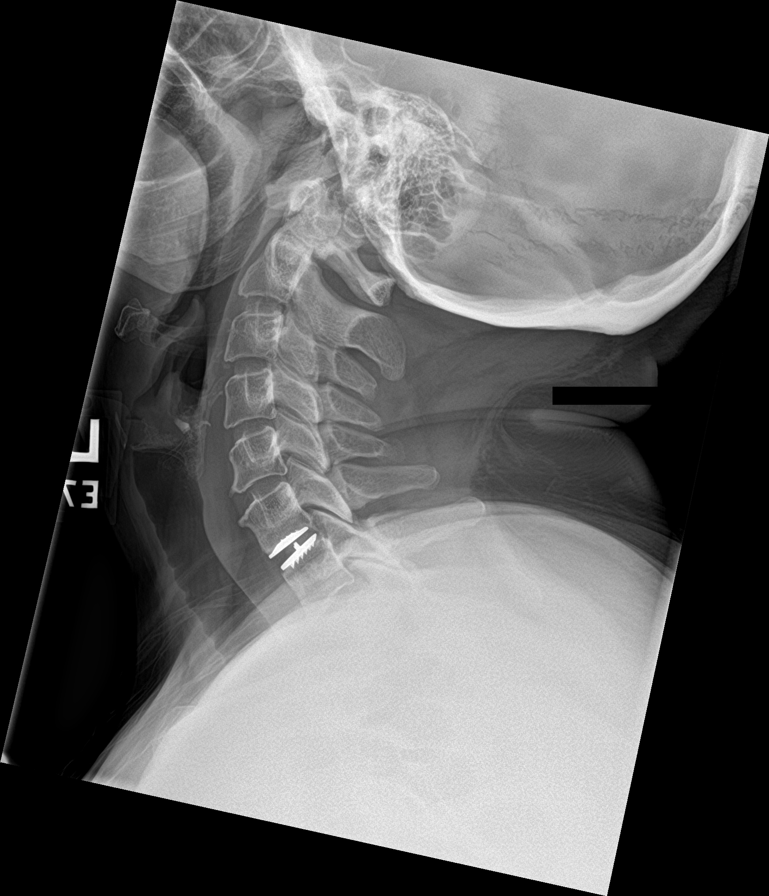
[im 4/9]
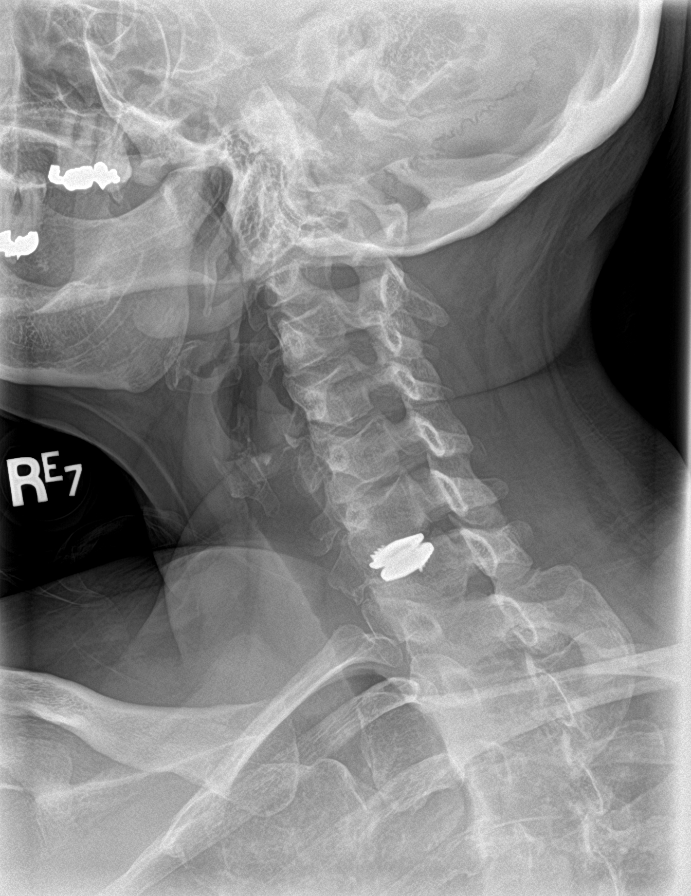
[im 5/9]
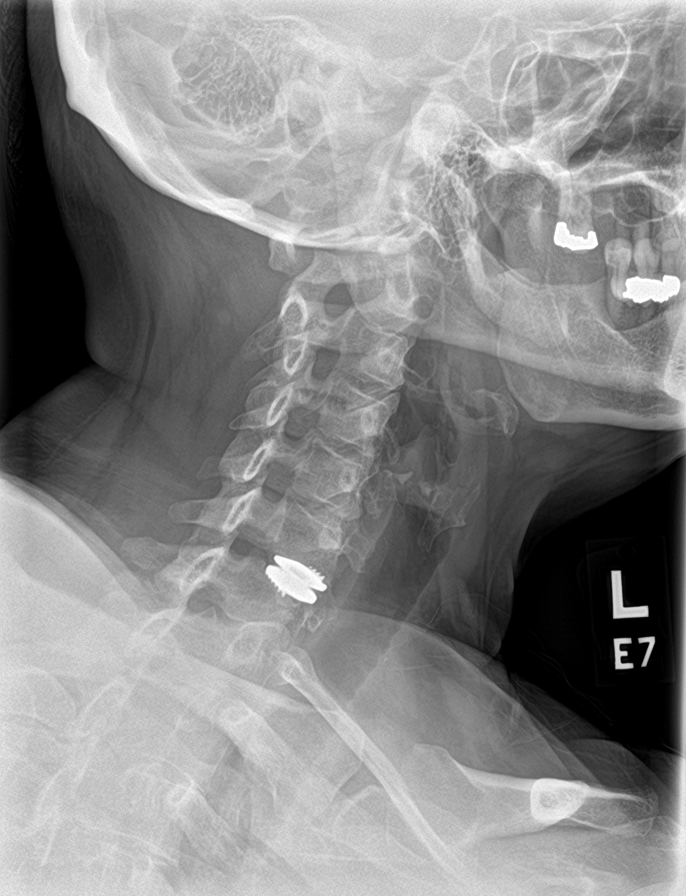
[im 6/9]
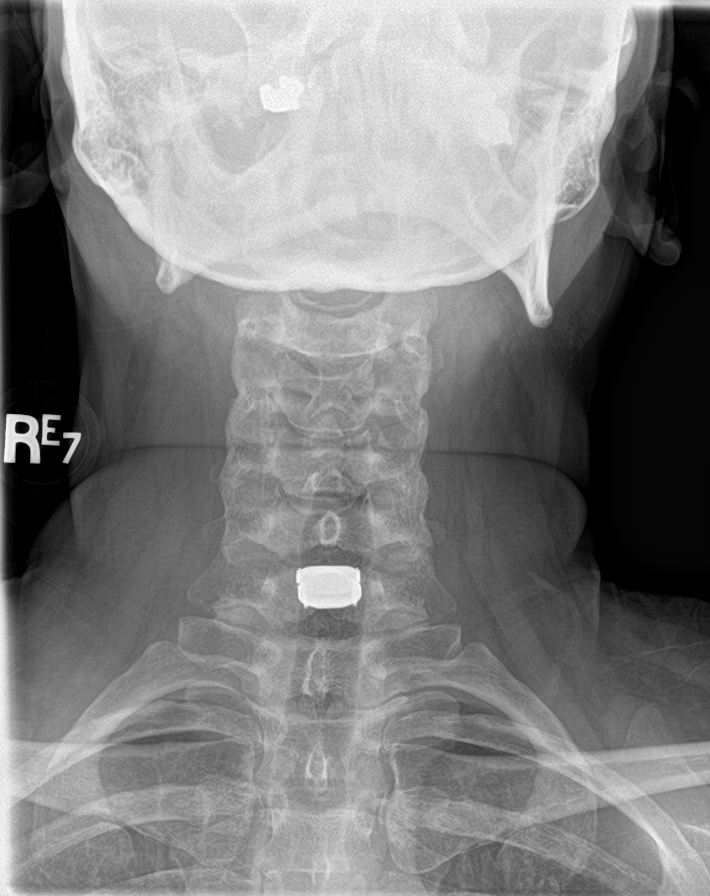
[im 7/9]
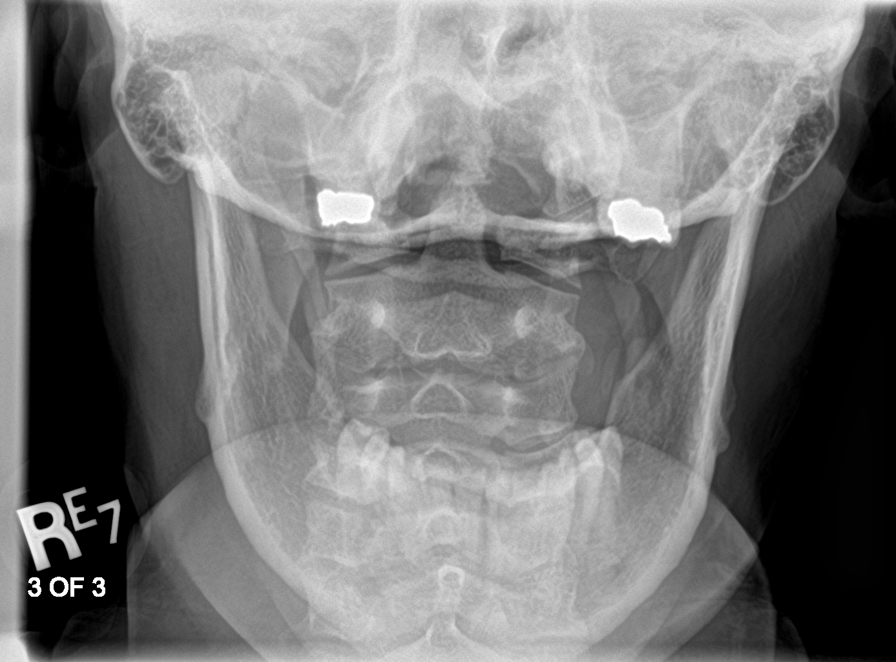
[im 8/9]
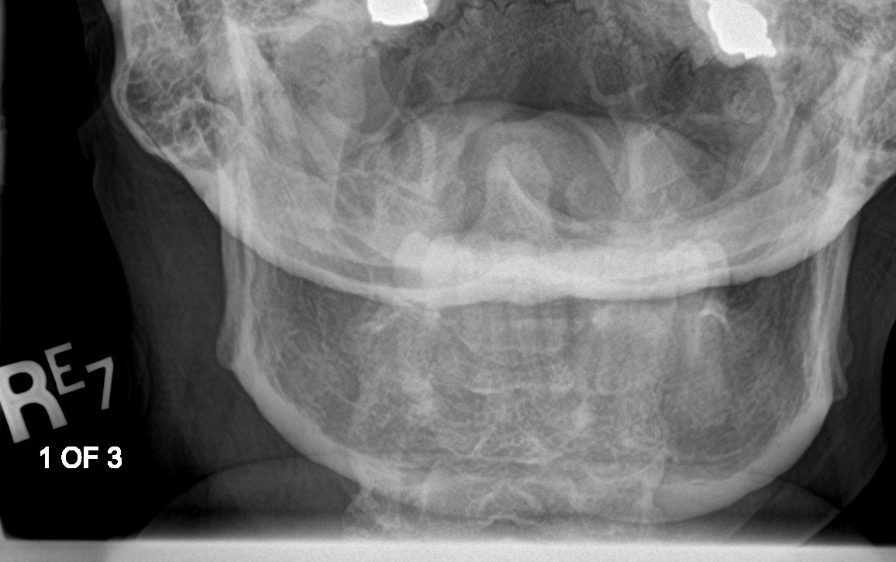
[im 9/9]
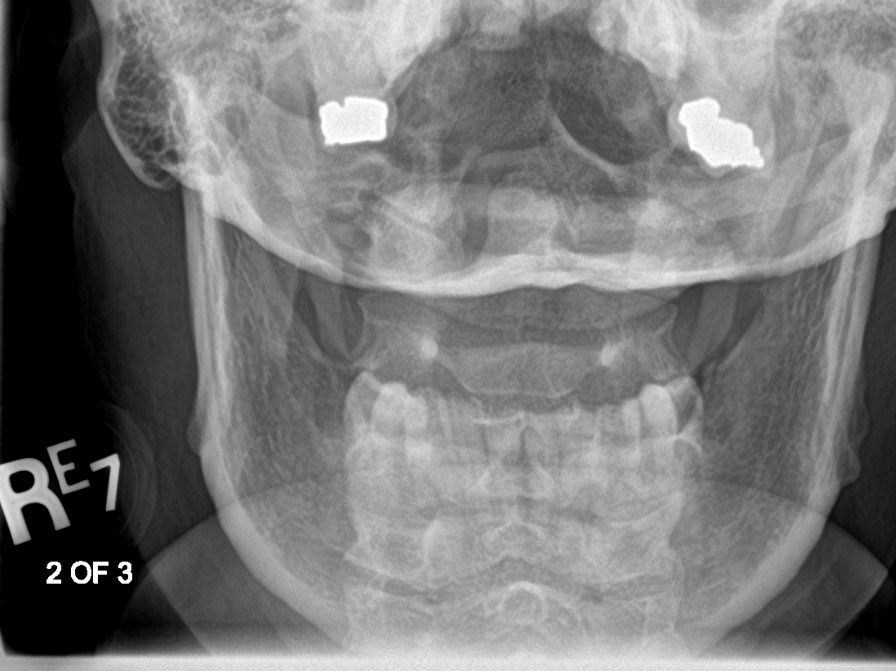

[9 of 9 positions shown; findings below may reference images not displayed]

FINDINGS: There is no acute fracture or subluxation of the cervical spine. No
subluxation on the flexion or extension views. C6-C7 prostatic disc.
The visualized posterior elements and odontoid appear intact. There
is anatomic alignment of the lateral masses of C1 and C2. The neural
foramina appear patent. The soft tissues are unremarkable.
IMPRESSION: 1. No acute/traumatic cervical spine pathology.
2. C6-C7 prostatic disc.

## 2022-03-05 ENCOUNTER — Other Ambulatory Visit: Payer: Self-pay

## 2022-03-05 MED ORDER — KETOROLAC TROMETHAMINE 10 MG PO TABS
ORAL_TABLET | ORAL | 0 refills | Status: AC
  Filled 2022-03-05: qty 20, 5d supply, fill #0

## 2022-03-06 ENCOUNTER — Other Ambulatory Visit: Payer: Self-pay

## 2022-03-28 ENCOUNTER — Other Ambulatory Visit: Payer: Self-pay

## 2022-04-17 ENCOUNTER — Other Ambulatory Visit: Payer: Self-pay
